# Patient Record
Sex: Female | Born: 1985 | Race: Black or African American | Hispanic: No | Marital: Single | Smoking: Never smoker
Health system: Southern US, Community
[De-identification: ages and names within clinical notes are randomized; demographics above are authoritative.]

## PROBLEM LIST (undated history)

## (undated) DIAGNOSIS — E611 Iron deficiency: Secondary | ICD-10-CM

## (undated) DIAGNOSIS — I10 Essential (primary) hypertension: Secondary | ICD-10-CM

## (undated) HISTORY — DX: Iron deficiency: E61.1

## (undated) HISTORY — PX: EYE SURGERY: SHX253

## (undated) HISTORY — PX: TONSILLECTOMY: SUR1361

## (undated) HISTORY — DX: Essential (primary) hypertension: I10

---

## 2019-06-26 ENCOUNTER — Ambulatory Visit: Payer: Self-pay | Admitting: Physician Assistant

## 2019-06-26 ENCOUNTER — Other Ambulatory Visit: Payer: Self-pay

## 2019-06-26 DIAGNOSIS — Z113 Encounter for screening for infections with a predominantly sexual mode of transmission: Secondary | ICD-10-CM

## 2019-06-26 LAB — WET PREP FOR TRICH, YEAST, CLUE
Trichomonas Exam: NEGATIVE
Yeast Exam: NEGATIVE

## 2019-06-27 ENCOUNTER — Encounter: Payer: Self-pay | Admitting: Physician Assistant

## 2019-06-27 NOTE — Progress Notes (Signed)
    STI clinic/screening visit  Subjective:  Katie Sutton is a 33 y.o. female being seen today for an STI screening visit. The patient reports they do not have symptoms.  Patient has the following medical conditions:  There are no active problems to display for this patient.    Chief Complaint  Patient presents with  . SEXUALLY TRANSMITTED DISEASE    HPI  Patient reports she has had a new partner and would like a screening.  Denies any symptoms today.  LMP 06/11/2019 and normal.  Using condoms as her BCM and is not interested in hormonal BCM.  See flowsheet for further details and programmatic requirements.    The following portions of the patient's history were reviewed and updated as appropriate: allergies, current medications, past medical history, past social history, past surgical history and problem list.  Objective:  There were no vitals filed for this visit.  Physical Exam Constitutional:      General: She is not in acute distress.    Appearance: Normal appearance. She is obese.  HENT:     Head: Normocephalic and atraumatic.     Mouth/Throat:     Mouth: Mucous membranes are moist.     Pharynx: Oropharynx is clear. No oropharyngeal exudate or posterior oropharyngeal erythema.  Eyes:     Conjunctiva/sclera: Conjunctivae normal.  Neck:     Musculoskeletal: Neck supple.  Pulmonary:     Effort: Pulmonary effort is normal.  Abdominal:     Palpations: Abdomen is soft. There is no mass.     Tenderness: There is no abdominal tenderness. There is no guarding or rebound.  Genitourinary:    General: Normal vulva.     Rectum: Normal.     Comments: External genitalia/pubic area without nits, lice, edema, erythema, lesions and inguinal adenopathy. Vagina with normal mucosa and discharge, pH=4.5. Cervix without visible lesions.  Uterus firm, mobile, nt, no masses, no CMT, no adnexal tenderness or fullness. Lymphadenopathy:     Cervical: No cervical adenopathy.   Skin:    General: Skin is warm and dry.     Findings: No bruising, erythema, lesion or rash.  Neurological:     Mental Status: She is alert and oriented to person, place, and time.  Psychiatric:        Mood and Affect: Mood normal.        Behavior: Behavior normal.        Thought Content: Thought content normal.        Judgment: Judgment normal.       Assessment and Plan:  Katie Sutton is a 33 y.o. female presenting to the [facility] Department for STI screening  1. Screening for STD (sexually transmitted disease) Patient into clinic for screening and is without symptoms today. Rec condoms with all sex. Await test results.  Counseled that RN will call if needs to RTC for any treatment once results are back. - WET PREP FOR [place], [facility], [place] [facility] - Syphilis Serology, [facility]     No follow-ups on file.  No future appointments.  Jerene Dilling, PA

## 2019-08-07 ENCOUNTER — Other Ambulatory Visit: Payer: Self-pay

## 2019-08-07 ENCOUNTER — Ambulatory Visit: Payer: Self-pay | Admitting: Advanced Practice Midwife

## 2019-08-07 ENCOUNTER — Encounter: Payer: Self-pay | Admitting: Advanced Practice Midwife

## 2019-08-07 DIAGNOSIS — Z113 Encounter for screening for infections with a predominantly sexual mode of transmission: Secondary | ICD-10-CM

## 2019-08-07 DIAGNOSIS — F1290 Cannabis use, unspecified, uncomplicated: Secondary | ICD-10-CM | POA: Insufficient documentation

## 2019-08-07 DIAGNOSIS — T7422XA Child sexual abuse, confirmed, initial encounter: Secondary | ICD-10-CM | POA: Insufficient documentation

## 2019-08-07 DIAGNOSIS — T7422XS Child sexual abuse, confirmed, sequela: Secondary | ICD-10-CM

## 2019-08-07 LAB — WET PREP FOR TRICH, YEAST, CLUE
Trichomonas Exam: NEGATIVE
Yeast Exam: NEGATIVE

## 2019-08-07 MED ORDER — METRONIDAZOLE 500 MG PO TABS: 500.0000 mg | ORAL_TABLET | Freq: Two times a day (BID) | ORAL | 0 refills | Status: AC

## 2019-08-07 NOTE — Addendum Note (Signed)
Addended by: Aileen Fass on: 08/07/2019 04:13 PM   Modules accepted: Orders

## 2019-08-07 NOTE — Progress Notes (Signed)
Wet mount reviewed. Patient tx'd for BV per SO
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
## 2019-08-26 ENCOUNTER — Ambulatory Visit: Payer: Medicaid Other | Admitting: Licensed Clinical Social Worker

## 2019-09-25 ENCOUNTER — Ambulatory Visit: Payer: Medicaid Other | Admitting: Licensed Clinical Social Worker

## 2019-09-25 DIAGNOSIS — F321 Major depressive disorder, single episode, moderate: Secondary | ICD-10-CM
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
## 2019-09-26 ENCOUNTER — Encounter: Payer: Self-pay | Admitting: Licensed Clinical Social Worker

## 2019-10-04 ENCOUNTER — Other Ambulatory Visit: Payer: Self-pay

## 2019-10-04 ENCOUNTER — Ambulatory Visit
Admission: EM | Admit: 2019-10-04 | Discharge: 2019-10-04 | Disposition: A | Discharge status: Home / Self Care | Payer: Self-pay | Attending: Family Medicine | Admitting: Family Medicine

## 2019-10-04 DIAGNOSIS — L03213 Periorbital cellulitis: Secondary | ICD-10-CM

## 2019-10-04 MED ORDER — AMOXICILLIN 875 MG PO TABS: 875.0000 mg | ORAL_TABLET | Freq: Two times a day (BID) | ORAL | 0 refills | Status: AC

## 2019-10-04 MED ORDER — SULFAMETHOXAZOLE-TRIMETHOPRIM 800-160 MG PO TABS: 1.0000 | ORAL_TABLET | Freq: Two times a day (BID) | ORAL | 0 refills | Status: AC

## 2019-10-04 NOTE — ED Triage Notes (Signed)
Patient complains of swelling above her left eye that has been intermittent since 01/21. States that area is painful at times. Patient states that she has tried Benadryl without much success.
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
## 2019-10-04 NOTE — Discharge Instructions (Addendum)
Take medication as prescribed. Warm compresses. Good hand hygiene.   Follow-up with ophthalmology in 3 days if symptoms persisting and not improving.  Follow up with your primary care physician this week as needed. Return to Urgent care for new or worsening concerns.

## 2019-10-06 ENCOUNTER — Ambulatory Visit: Payer: Medicaid Other | Admitting: Licensed Clinical Social Worker

## 2019-10-06 DIAGNOSIS — F321 Major depressive disorder, single episode, moderate: Secondary | ICD-10-CM

## 2019-10-06 NOTE — Progress Notes (Signed)
Counselor/Therapist Progress Note  Patient ID: Katie Sutton, MRN: [id number],    Date: 10/06/2019  Time Spent: 45 minutes    Treatment Type: Individual Therapy  Reported Symptoms: Obsessive thinking, Anhedonia, Sleep disturbance and depression  Mental Status Exam:  Appearance:   NA     Behavior:  Appropriate and Sharing  Motor:  NA  Speech/Language:   Normal Rate  Affect:  NA  Mood:  normal  Thought process:  normal  Thought content:    WNL  Sensory/Perceptual disturbances:    WNL  Orientation:  oriented to person, place, time/date, situation and day of week  Attention:  Good  Concentration:  Good  Memory:  WNL  Fund of knowledge:   Good  Insight:    Good  Judgment:   Good  Impulse Control:  Good   Risk Assessment: Danger to Self:  No Self-injurious Behavior: No Danger to Others: No Duty to Warn:no Physical Aggression / Violence:No  Access to Firearms a concern: No  Gang Involvement:No   Subjective: Patient was engaged and cooperative throughout the session using time effectively to discuss thoughts, feelings and treatment plan. Patient voices continued motivation for treatment and understanding of depression issues. Patient is likely to benefit from future treatment because she remains motivated to decrease depression and improve functioning.    Interventions: Cognitive Behavioral Therapy Established psychological safety. Checked in with patient and reviewed previous session, including assessment and goal of treatment. Reviewed CBTs. Explored patient's goal of treatment and worked collaboratively to develop CBT treatment plan. Provided support through active listening, validation of feelings, and highlighted patient's strengths.  Diagnosis:   ICD-10-CM   1. Major depressive disorder, single episode, moderate (HCC)  F32.1     Plan: patient's goal is to be happy to be able to feel the way she did before she lost her mom, to say her mom's name without crying    Treatment Target: Understand the relationship between thoughts, emotions, and behaviors  - Psychoeducation on CBT model   - Teach the connection between thoughts, emotions, and behaviors  Treatment Target: Continue to understand mood and behaviors  - Continue to assess symptoms  - Identify thoughts, emotions and behaviors that are problematic  - Identify possible interventions  Treatment Target: Increase understanding of grief and loss Objectives/treatment focus: Marland Kitchen Develop vocabulary to describe feelings of grief and loss . Identify grief and loss issues  . Identify steps toward managing grief . Gain awareness, and accept that grief and loss is causing problems Treatment Target: Increase realistic balanced thinking  - Explore patient's thoughts, beliefs, automatic thoughts, assumptions  - Identify unhelpful thinking patterns  - Process distress and allow for emotional release  - Evaluate thoughts - Cognitive reappraisal  Treatment Target: Reducing vulnerability to "emotional mind" - Values clarification   - Self-care - nutrition, sleep, exercise  - Teach mindfulness-  focus on awareness of thoughts and feelings without attachment or judgment - Practice Mindfulness  - Increase positive events  o Activity planning   Future Appointments  Date Time Provider Department Center  10/13/2019  4:00 PM Kathreen Cosier, LCSW [facility] None    Interpreter used: NA   Kathreen Cosier, LCSW

## 2019-10-13 ENCOUNTER — Ambulatory Visit: Payer: Medicaid Other | Admitting: Licensed Clinical Social Worker

## 2019-10-14 ENCOUNTER — Telehealth: Payer: Self-pay | Admitting: Licensed Clinical Social Worker

## 2019-10-14 NOTE — Telephone Encounter (Signed)
-----   Message from Kathreen Cosier, [place] sent at 10/13/2019  4:06 PM EST ----- Regarding: missed appt Call patient to follow up on missed appt.

## 2019-10-16 ENCOUNTER — Ambulatory Visit: Payer: Medicaid Other | Admitting: Physician Assistant

## 2019-10-16 ENCOUNTER — Other Ambulatory Visit: Payer: Self-pay

## 2019-10-16 ENCOUNTER — Encounter: Payer: Self-pay | Admitting: Physician Assistant

## 2019-10-16 DIAGNOSIS — Z113 Encounter for screening for infections with a predominantly sexual mode of transmission: Secondary | ICD-10-CM

## 2019-10-16 LAB — WET PREP FOR TRICH, YEAST, CLUE
Trichomonas Exam: NEGATIVE
Yeast Exam: NEGATIVE
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
## 2019-12-12 ENCOUNTER — Other Ambulatory Visit: Payer: Self-pay

## 2019-12-12 ENCOUNTER — Other Ambulatory Visit: Payer: Self-pay | Admitting: Ophthalmology

## 2019-12-12 DIAGNOSIS — H04033 Chronic enlargement of bilateral lacrimal glands: Secondary | ICD-10-CM

## 2019-12-17 ENCOUNTER — Ambulatory Visit: Payer: Medicaid Other

## 2019-12-23 ENCOUNTER — Other Ambulatory Visit: Payer: Self-pay

## 2019-12-23 ENCOUNTER — Ambulatory Visit
Admission: RE | Admit: 2019-12-23 | Discharge: 2019-12-23 | Disposition: A | Discharge status: Home / Self Care | Payer: Medicaid Other | Source: Ambulatory Visit | Attending: Ophthalmology | Admitting: Ophthalmology

## 2019-12-23 DIAGNOSIS — H04033 Chronic enlargement of bilateral lacrimal glands: Secondary | ICD-10-CM

## 2019-12-23 MED ORDER — IOHEXOL 300 MG/ML  SOLN
75.0000 mL | Freq: Once | INTRAMUSCULAR | Status: AC | PRN
  Administered 2019-12-23: 75 mL via INTRAVENOUS

## 2020-01-12 ENCOUNTER — Ambulatory Visit: Payer: Self-pay | Admitting: Physician Assistant

## 2020-01-12 ENCOUNTER — Encounter: Payer: Self-pay | Admitting: Physician Assistant

## 2020-01-12 ENCOUNTER — Other Ambulatory Visit: Payer: Self-pay

## 2020-01-12 DIAGNOSIS — Z113 Encounter for screening for infections with a predominantly sexual mode of transmission: Secondary | ICD-10-CM

## 2020-01-12 LAB — WET PREP FOR TRICH, YEAST, CLUE
Trichomonas Exam: NEGATIVE
Yeast Exam: NEGATIVE
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
## 2020-01-17 LAB — GONOCOCCUS CULTURE

## 2020-04-01 ENCOUNTER — Encounter: Payer: Self-pay | Admitting: Physician Assistant

## 2020-04-01 ENCOUNTER — Ambulatory Visit: Payer: Self-pay | Admitting: Physician Assistant

## 2020-04-01 ENCOUNTER — Other Ambulatory Visit: Payer: Self-pay

## 2020-04-01 ENCOUNTER — Ambulatory Visit: Payer: Medicaid Other

## 2020-04-01 DIAGNOSIS — Z113 Encounter for screening for infections with a predominantly sexual mode of transmission: Secondary | ICD-10-CM

## 2020-04-01 DIAGNOSIS — A599 Trichomoniasis, unspecified: Secondary | ICD-10-CM

## 2020-04-01 LAB — WET PREP FOR TRICH, YEAST, CLUE
Trichomonas Exam: POSITIVE — AB
Yeast Exam: NEGATIVE

## 2020-04-01 MED ORDER — METRONIDAZOLE 500 MG PO TABS: 2000.0000 mg | ORAL_TABLET | Freq: Once | ORAL | 0 refills | Status: AC
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
## 2020-04-01 NOTE — Progress Notes (Signed)
Wet mount reviewed, patient treated for Trich per SO..Cass Edinger Brewer-Jensen, RN  ?

## 2020-04-01 NOTE — Progress Notes (Signed)
Here for STD testing..Tennis Mckinnon Brewer-Jensen, RN  

## 2020-04-16 ENCOUNTER — Ambulatory Visit: Payer: Self-pay

## 2020-06-30 ENCOUNTER — Ambulatory Visit: Payer: Medicaid Other

## 2020-07-08 ENCOUNTER — Ambulatory Visit: Payer: Medicaid Other | Admitting: Physician Assistant

## 2020-07-08 ENCOUNTER — Encounter: Payer: Self-pay | Admitting: Physician Assistant

## 2020-07-08 ENCOUNTER — Other Ambulatory Visit: Payer: Self-pay

## 2020-07-08 DIAGNOSIS — Z113 Encounter for screening for infections with a predominantly sexual mode of transmission: Secondary | ICD-10-CM

## 2020-07-08 DIAGNOSIS — B9689 Other specified bacterial agents as the cause of diseases classified elsewhere: Secondary | ICD-10-CM

## 2020-07-08 DIAGNOSIS — N760 Acute vaginitis: Secondary | ICD-10-CM

## 2020-07-08 LAB — WET PREP FOR TRICH, YEAST, CLUE
Trichomonas Exam: NEGATIVE
Yeast Exam: NEGATIVE

## 2020-07-08 MED ORDER — METRONIDAZOLE 500 MG PO TABS: 500.0000 mg | ORAL_TABLET | Freq: Two times a day (BID) | ORAL | 0 refills | Status: AC

## 2020-07-08 NOTE — Progress Notes (Signed)
Wet mount reviewed with provider and pt treated for BV per provider verbal order and per standing order. Provider orders completed.
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
## 2020-07-13 LAB — GONOCOCCUS CULTURE

## 2020-07-28 ENCOUNTER — Ambulatory Visit
Admission: RE | Admit: 2020-07-28 | Discharge: 2020-07-28 | Disposition: A | Discharge status: Home / Self Care | Payer: Medicaid Other | Source: Ambulatory Visit | Attending: Emergency Medicine | Admitting: Emergency Medicine

## 2020-07-28 ENCOUNTER — Other Ambulatory Visit: Payer: Self-pay

## 2020-07-28 VITALS — BP 128/69 | HR 71 | Temp 98.2°F | Resp 18 | Ht 62.0 in | Wt 240.0 lb

## 2020-07-28 DIAGNOSIS — N6320 Unspecified lump in the left breast, unspecified quadrant: Secondary | ICD-10-CM | POA: Diagnosis not present

## 2020-07-28 DIAGNOSIS — N6310 Unspecified lump in the right breast, unspecified quadrant: Secondary | ICD-10-CM | POA: Diagnosis not present

## 2020-07-28 DIAGNOSIS — N61 Mastitis without abscess: Secondary | ICD-10-CM

## 2020-07-28 MED ORDER — CEPHALEXIN 500 MG PO CAPS: 1000.0000 mg | ORAL_CAPSULE | Freq: Two times a day (BID) | ORAL | 0 refills | Status: AC

## 2020-07-28 MED ORDER — IBUPROFEN 600 MG PO TABS: 600.0000 mg | ORAL_TABLET | Freq: Four times a day (QID) | ORAL | 0 refills | Status: DC | PRN

## 2020-07-28 NOTE — Discharge Instructions (Addendum)
You have an infection of your right breast.  Finish the Keflex, even if you feel better.  Warm compresses.  Check a urine pregnancy.  If it is negative, then you can take the ibuprofen combined with 1000 mg of Tylenol together 3-4 times a day as needed for pain.  If it is positive, we can only take the Tylenol.  You will need to follow-up with OB/GYN ASAP.  I have ordered a mammogram to be done at the [facility] breast center.  They will be able to coordinate all of your care if there are any problems found.  You can also call them and schedule an appointment/mammogram.  Follow-up with a primary care provider of your choice, see list below.  Here is a list of primary care providers who are taking new patients:  Dr. Elizabeth Sauer [address] [place] [address] [phone]  [facility] at [facility] [address] [place], [place] [address] [phone]  [facility] [place] [address] [place] [address]  [phone]  [facility] [address] [place], [place] [address] [phone]  [facility] [address] [place]  [id number] [place], [place] [address]  Here are clinics/ other resources who will see you if you do not have insurance. Some have certain criteria that you must meet. Call them and find out what they are:  [facility]: [address], [place], [place] [address] Phone: [phone] Hours: First and Third Saturdays of each Month, 9 a.m. - 1 p.m.  [facility]: [address], [address] [place], [place] [address] Phone: [phone] Hours: Tuesday, 4 p.m. - 8 p.m. Thursday, 1 p.m. - 8 p.m. Wednesday, 9 a.m. - [facility] [address], [place], [place] [address] Phone: [phone] Pharmacy Phone Number: [phone] Dental Phone Number: [phone] [facility] Insurance Help: [phone]  Dental Hours: Monday - Thursday, 8 a.m. - 6 p.m.  [facility] [address] [place], [place] [address] Phone: [phone] Pharmacy Phone Number: [phone] [facility] Insurance Help: [phone]  [facility] [address] [place] [place], [place] [address] Phone: [phone] Pharmacy Phone Number: [phone] [facility] Insurance Help: [phone]  [facility] [address] [place], [place] [address] Phone: [phone] [facility] Insurance Help: [phone]   [facility] [address] [place], [place] [address] Phone: [phone]  Go to www.goodrx.com to look up your medications. This will give you a list of where you can find your prescriptions at the most affordable prices. Or ask the pharmacist what the cash price is, or if they have any other discount programs available to help make your medication more affordable. This can be less expensive than what you would pay with insurance.

## 2020-07-28 NOTE — ED Provider Notes (Signed)
HPI  SUBJECTIVE:  Katie Sutton is a 34 y.o. female who presents with an erythematous, painful area on her right breast over the past 4 days.  She is not sure if it has changed in size.  She states she felt feverish 2 days ago, but did not have a thermometer.  She has a 41-year-old nipple piercing in this breast, removed the jewelry 2 days ago.  No recent change in jewelry change.  No axillary pain, body aches.  No nipple discharge.  She reports some discharge from the piercing tract.  No skin changes.  No nipple swelling, unintentional weight loss, night sweats.  She tried 1000 g of Tylenol 2 days ago with improvement in her symptoms.  Symptoms are worse with wearing a bra, palpation.  No antibiotics in the past month.  No antipyretic in the past 6 hours.  She does not do breast self exams.  She has never had a mammogram.  She is not breast-feeding.  No history of diabetes, MRSA, abscesses, hypertension, HIV, cancer, immunocompromise.  Family history negative for breast cancer.  LMP: 11/8.  She is not sure if she could be pregnant.  She is not on any birth control.  PMD: None.    History reviewed. No pertinent past medical history.  Past Surgical History:  Procedure Laterality Date  . CESAREAN SECTION    . TONSILLECTOMY      Family History  Problem Relation Age of Onset  . Cancer Mother   . Cancer Father     Social History   Tobacco Use  . Smoking status: Never Smoker  . Smokeless tobacco: Never Used  Vaping Use  . Vaping Use: Never used  Substance Use Topics  . Alcohol use: Yes    Comment: occasionally  . Drug use: Yes    Types: Marijuana    Comment: last use 07/31/19    No current facility-administered medications for this encounter.  Current Outpatient Medications:  .  cephALEXin (KEFLEX) 500 MG capsule, Take 2 capsules (1,000 mg total) by mouth 2 (two) times daily for 7 days., Disp: 28 capsule, Rfl: 0 .  ibuprofen (ADVIL) 600 MG tablet, Take 1 tablet (600 mg total)  by mouth every 6 (six) hours as needed., Disp: 30 tablet, Rfl: 0  No Known Allergies   ROS  As noted in HPI.   Physical Exam  BP 128/69 (BP Location: Right Arm)   Pulse 71   Temp 98.2 F (36.8 C) (Oral)   Resp 18   Ht 5\' 2"  (1.575 m)   Wt 108.9 kg   LMP 07/12/2020   SpO2 100%   BMI 43.90 kg/m   Constitutional: Well developed, well nourished, no acute distress Eyes:  EOMI, conjunctiva normal bilaterally HENT: Normocephalic, atraumatic,mucus membranes moist Respiratory: Normal inspiratory effort Cardiovascular: Normal rate Breast: Breast pendulous, symmetric.  Right breast: 7 x 8 cm tender area of erythema in the 10 o'clock position.  Marked area of erythema with a marker for reference.  No skin changes, induration.  Small nontender mass in the 1 o'clock position, another small nontender mobile mass in the 7 o'clock position.  No nipple drainage.  No drainage from the piercing.  No axillary lymphadenopathy Left breast.  Nontender small mobile mass in the 6 o'clock position.  No skin changes, erythema.  No axillary lymphadenopathy.  Patient declined chaperone. GI: nondistended skin: No rash, skin intact Musculoskeletal: no deformities Neurologic: Alert & oriented x 3, no focal neuro deficits Psychiatric: Speech and behavior appropriate  ED Course   Medications - No data to display  Orders Placed This Encounter  Procedures  . MM Digital Diagnostic Bilat    Standing Status:   Future    Standing Expiration Date:   07/28/2021    Order Specific Question:   Reason for Exam (SYMPTOM  OR DIAGNOSIS REQUIRED)    Answer:   bilateral breast masses    Order Specific Question:   Is the patient pregnant?    Answer:   No    Order Specific Question:   [facility]    [facility]    Order Specific Question:   Release to patient    Answer:   Immediate    No results found for this or any previous visit (from the past 24 hour(s)). No results  found.  ED Clinical Impression  1. Mastitis, acute   2. Masses of both breasts      ED Assessment/Plan  Patient with a mastitis.  Will send home with Keflex 1000 mg twice daily for 7 days.  She also has several nontender masses in both breasts.  She has no axillary lymphadenopathy.  She has never had a mammogram.  Suspect fibrocystic breasts rather than malignancy, however will order diagnostic mammogram at the breast center of [facility] with ultrasound if necessary.    Patient has decided to check at home urine pregnancy test rather than wait for 1 here.  We will send her home with a prescription for ibuprofen.  She is to check a pregnancy test before starting this.  If pregnant, advised her to not fill this but take Tylenol only.  Advised that she will need to follow-up with an OB/GYN if she is pregnant.  Also providing primary care list for ongoing care.   Discussed  MDM, treatment plan, and plan for follow-up with patient. patient agrees with plan.   Meds ordered this encounter  Medications  . ibuprofen (ADVIL) 600 MG tablet    Sig: Take 1 tablet (600 mg total) by mouth every 6 (six) hours as needed.    Dispense:  30 tablet    Refill:  0  . cephALEXin (KEFLEX) 500 MG capsule    Sig: Take 2 capsules (1,000 mg total) by mouth 2 (two) times daily for 7 days.    Dispense:  28 capsule    Refill:  0    *This clinic note was created using Scientist, clinical (histocompatibility and immunogenetics). Therefore, there may be occasional mistakes despite careful proofreading.   ?    Domenick Gong, MD 07/28/20 1246

## 2020-07-28 NOTE — ED Triage Notes (Signed)
Patient c/o right breast pain that started 3 days ago. She had her nipple pierced 2 years ago and took this out. She states she felt a knot above her nipple area.

## 2020-08-17 ENCOUNTER — Other Ambulatory Visit: Payer: Self-pay | Admitting: Emergency Medicine

## 2020-08-17 DIAGNOSIS — N63 Unspecified lump in unspecified breast: Secondary | ICD-10-CM

## 2020-09-28 ENCOUNTER — Ambulatory Visit
Admission: RE | Admit: 2020-09-28 | Discharge: 2020-09-28 | Disposition: A | Discharge status: Home / Self Care | Payer: Medicaid Other | Source: Ambulatory Visit | Attending: Emergency Medicine | Admitting: Emergency Medicine

## 2020-09-28 ENCOUNTER — Other Ambulatory Visit: Payer: Self-pay

## 2020-09-28 DIAGNOSIS — N63 Unspecified lump in unspecified breast: Secondary | ICD-10-CM

## 2020-11-05 ENCOUNTER — Ambulatory Visit: Payer: Medicaid Other

## 2020-11-12 ENCOUNTER — Ambulatory Visit: Payer: Medicaid Other

## 2020-11-22 ENCOUNTER — Ambulatory Visit: Payer: Medicaid Other | Admitting: Physician Assistant

## 2020-11-22 ENCOUNTER — Other Ambulatory Visit: Payer: Self-pay

## 2020-11-22 ENCOUNTER — Encounter: Payer: Self-pay | Admitting: Physician Assistant

## 2020-11-22 DIAGNOSIS — Z113 Encounter for screening for infections with a predominantly sexual mode of transmission: Secondary | ICD-10-CM

## 2020-11-22 LAB — WET PREP FOR TRICH, YEAST, CLUE
Trichomonas Exam: NEGATIVE
Yeast Exam: NEGATIVE

## 2020-11-22 LAB — HM HIV SCREENING LAB: HM HIV Screening: NEGATIVE
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
## 2021-07-25 IMAGING — MG DIGITAL DIAGNOSTIC BILAT W/ TOMO W/ CAD
6 of 10 series · 6 of 30 positions shown · non-contrast
Comparison: None.

CLINICAL DATA: Patient presents for evaluation of bilateral
physician detected palpable abnormalities.

EXAM:
DIGITAL DIAGNOSTIC BILATERAL MAMMOGRAM WITH CAD AND TOMOSYNTHESIS
ULTRASOUND BILATERAL BREAST
TECHNIQUE: Bilateral digital diagnostic mammography and breast tomosynthesis
was performed. Digital images of the breasts were evaluated with
computer-aided detection. Targeted ultrasound examination of the
Bilateral breast was performed.

[R CC synth-2D]
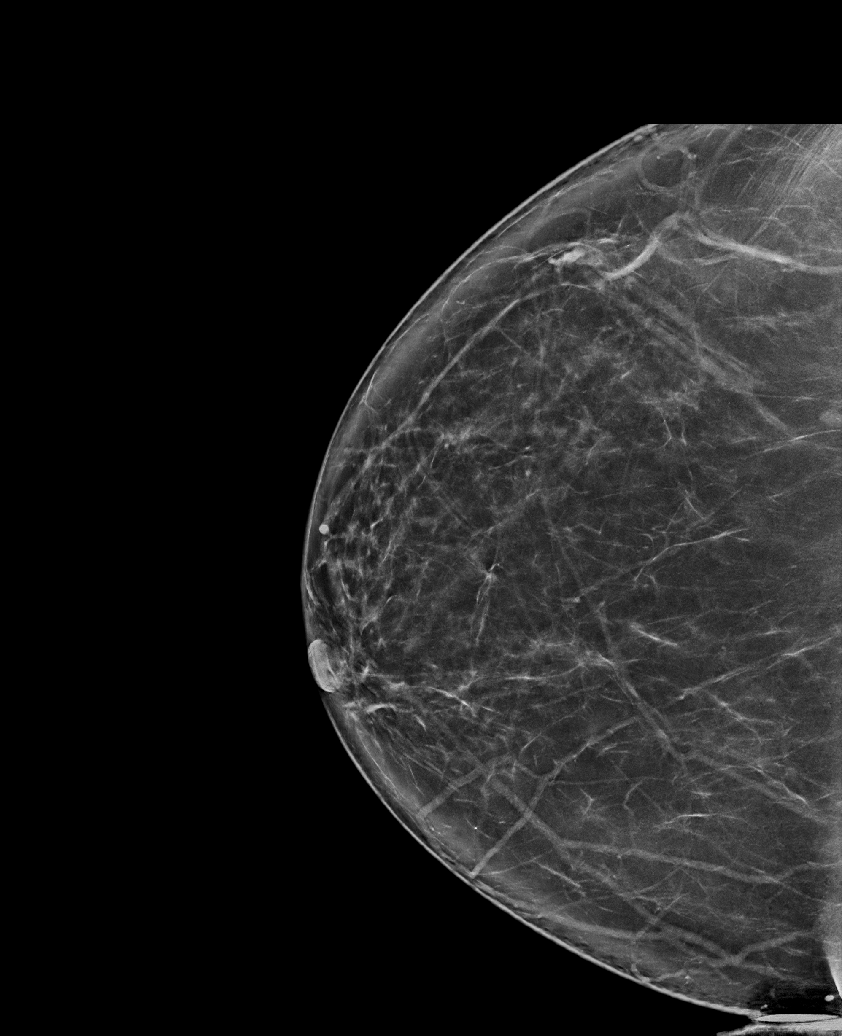

[L MLO synth-2D]
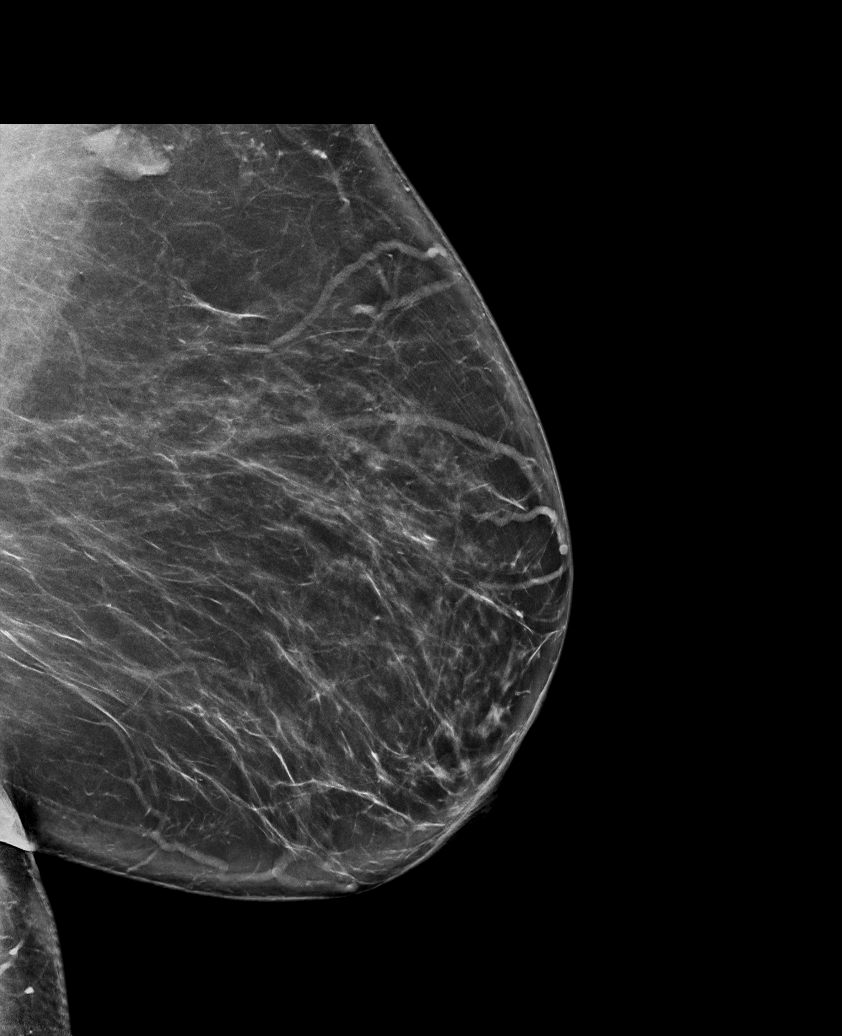

[R XCCL synth-2D]
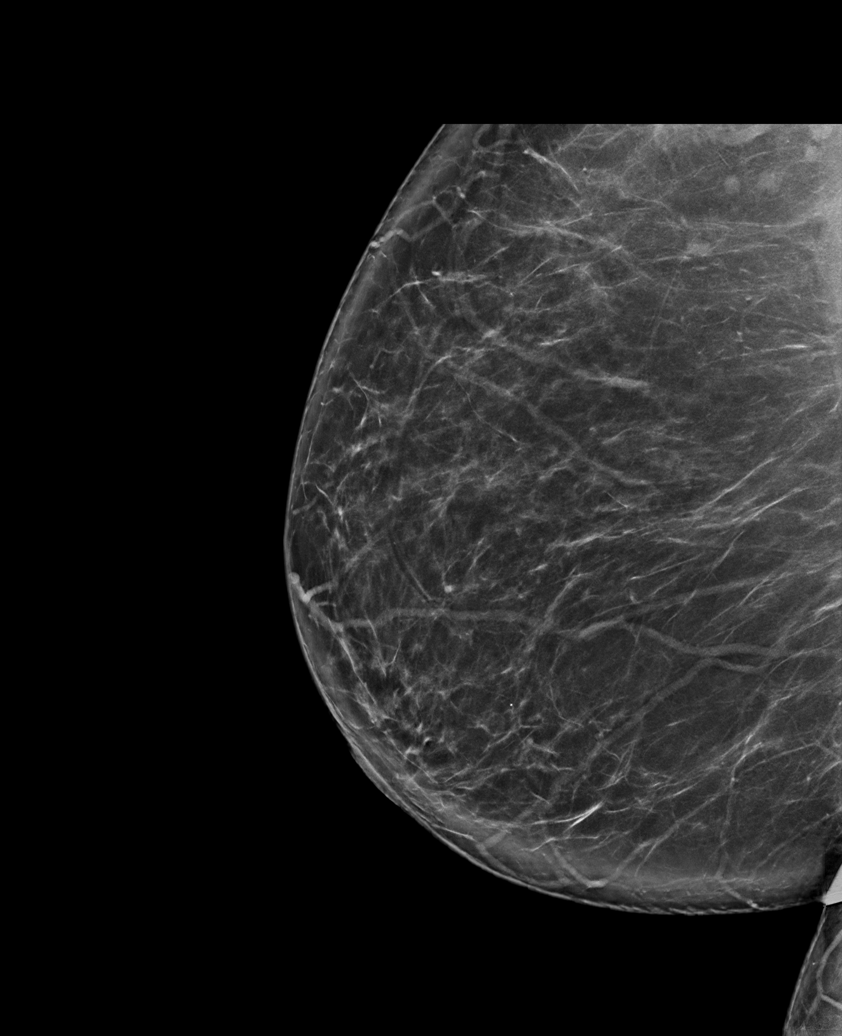

[R MLO synth-2D]
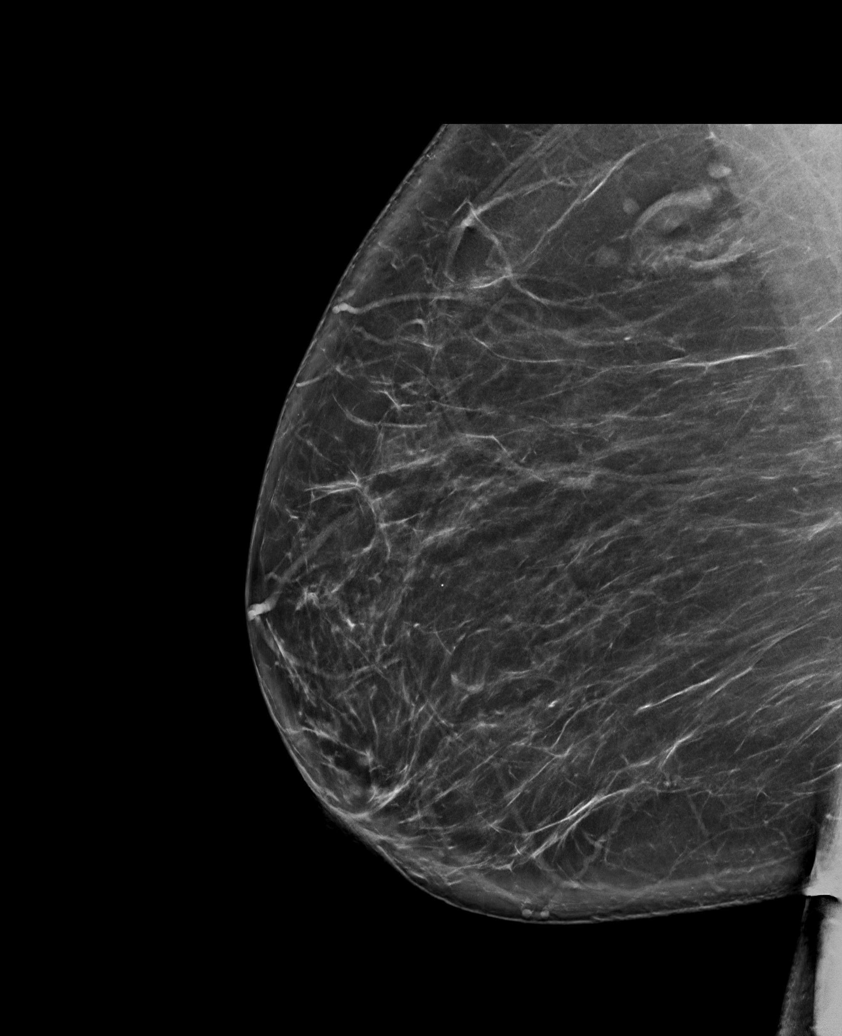

[L CC synth-2D]
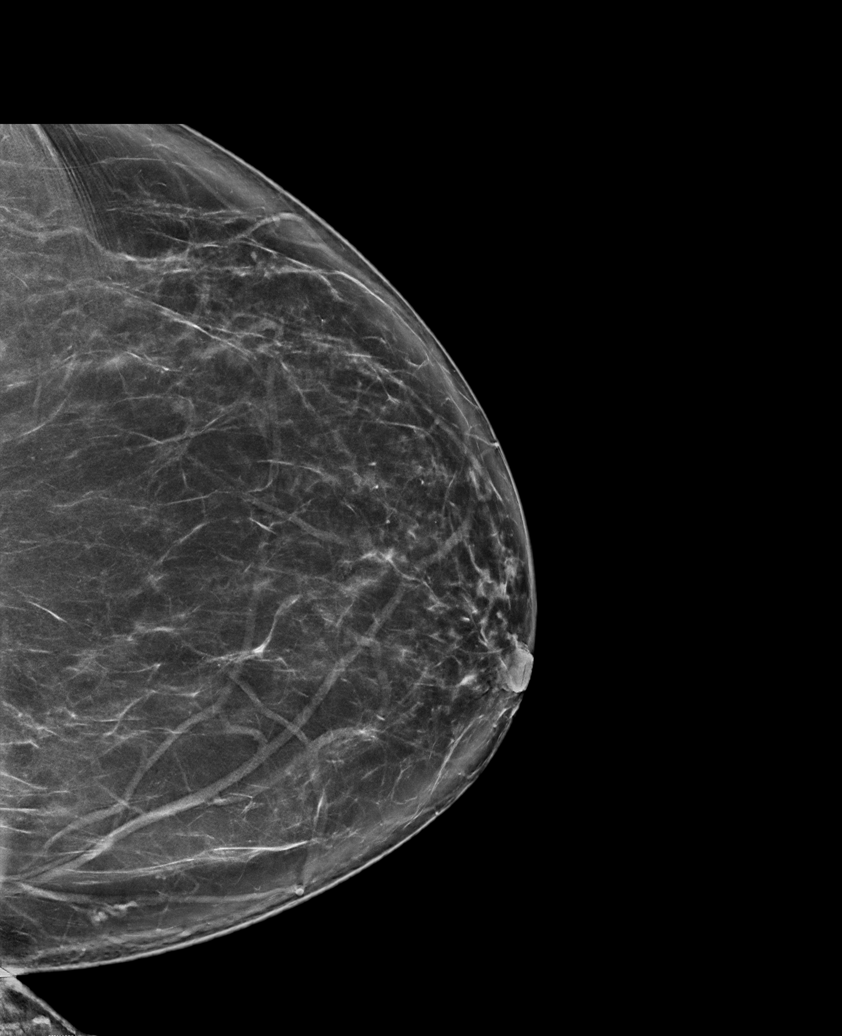

[R CC tomo · tomo slice 48/95.0]
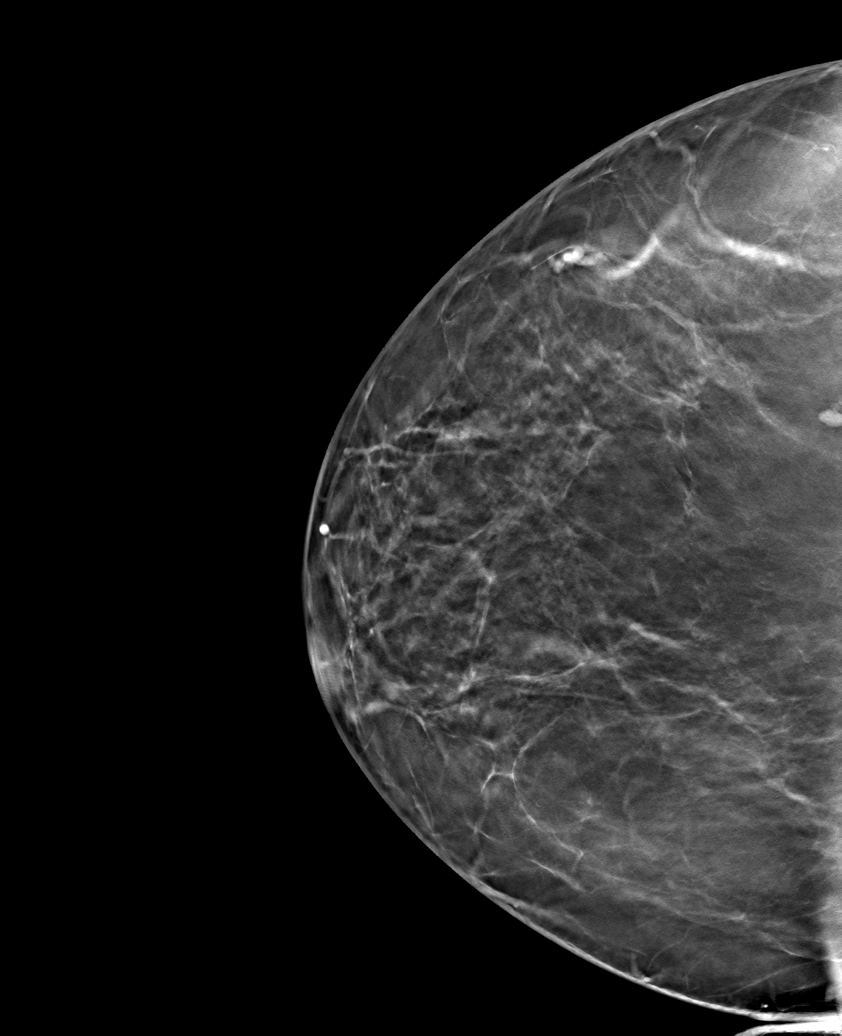

[6 of 30 positions shown; findings below may reference images not displayed]

ACR Breast Density Category b: There are scattered areas of
fibroglandular density.
FINDINGS: No concerning masses, calcifications or nonsurgical distortion
identified within either breast.

Targeted ultrasound is performed, showing normal tissue without
suspicious mass within the right breast 1 o'clock position at the
site of palpable concern, 10 o'clock position of the site of
palpable concern and 7 o'clock position at the site of palpable
concern.

Normal tissue without suspicious mass within the left breast 6
o'clock position at the site of palpable concern.
IMPRESSION: No mammographic evidence for malignancy.

No suspicious findings on ultrasound at the sites of palpable
concern bilaterally.

RECOMMENDATION:
Continued clinical evaluation for bilateral palpable abnormalities.

Screening mammogram at age 40 unless there are persistent or
intervening clinical concerns. (Code:H4-Y-QCE)

I have discussed the findings and recommendations with the patient.
If applicable, a reminder letter will be sent to the patient
regarding the next appointment.

BI-RADS CATEGORY  2: Benign.

## 2021-07-25 IMAGING — US US BREAST*R* LIMITED INC AXILLA
1 series · 3 of 3 positions shown · non-contrast
Comparison: None.

CLINICAL DATA: Patient presents for evaluation of bilateral
physician detected palpable abnormalities.

EXAM:
DIGITAL DIAGNOSTIC BILATERAL MAMMOGRAM WITH CAD AND TOMOSYNTHESIS
ULTRASOUND BILATERAL BREAST
TECHNIQUE: Bilateral digital diagnostic mammography and breast tomosynthesis
was performed. Digital images of the breasts were evaluated with
computer-aided detection. Targeted ultrasound examination of the
Bilateral breast was performed.

[Series 1: us breast*right* limited inc axilla · 0.08mm/px · 3 of 3 slices shown]
[im 1/3]
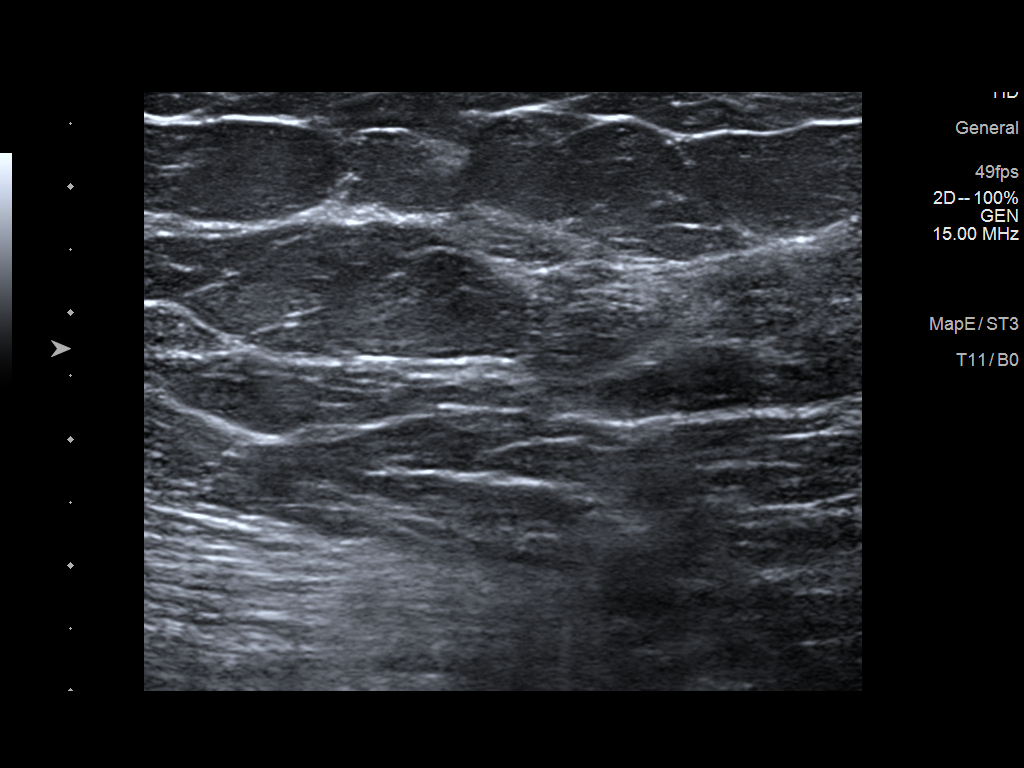
[im 2/3]
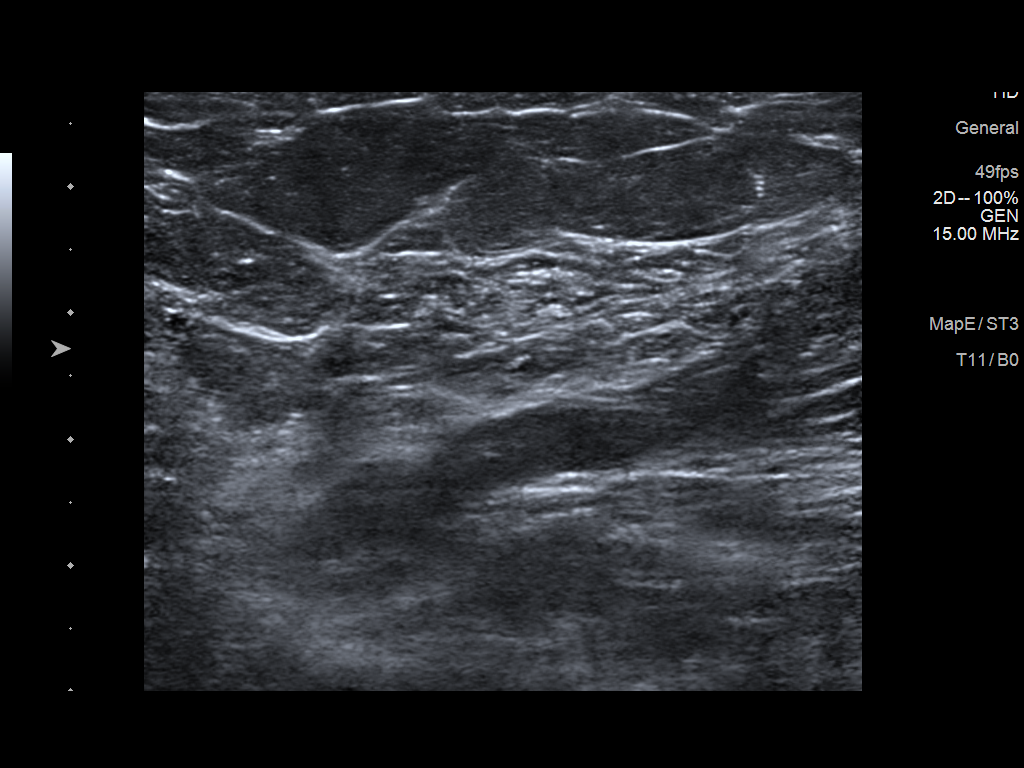
[im 3/3]
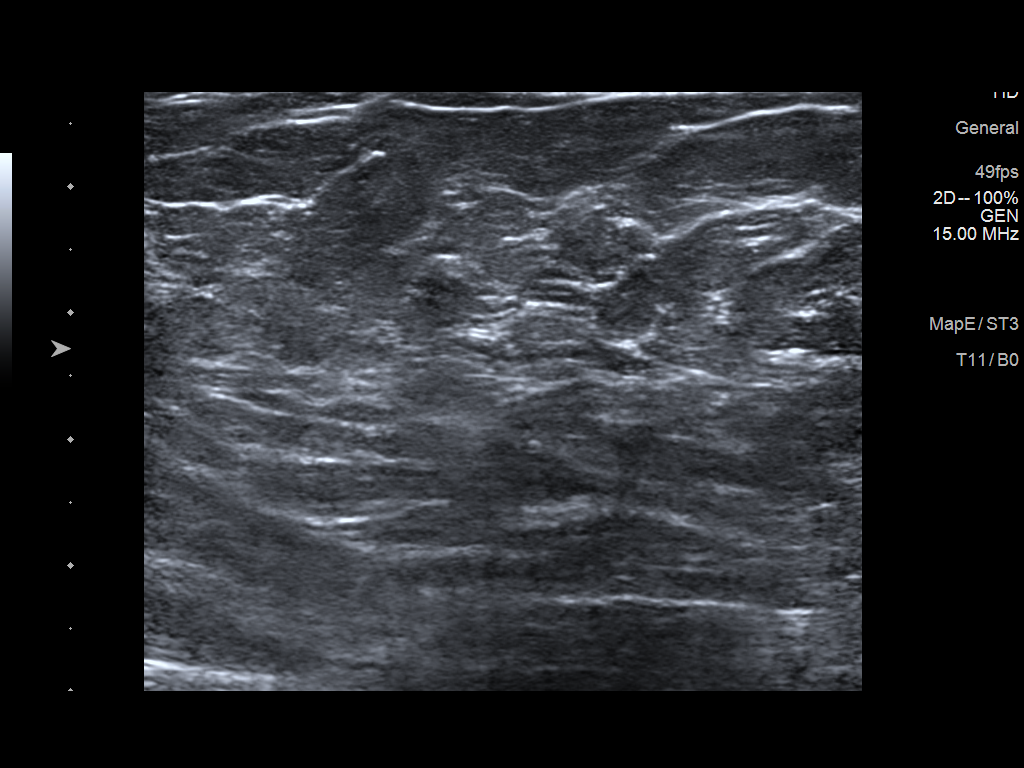

[3 of 3 positions shown; findings below may reference images not displayed]

ACR Breast Density Category b: There are scattered areas of
fibroglandular density.
FINDINGS: No concerning masses, calcifications or nonsurgical distortion
identified within either breast.

Targeted ultrasound is performed, showing normal tissue without
suspicious mass within the right breast 1 o'clock position at the
site of palpable concern, 10 o'clock position of the site of
palpable concern and 7 o'clock position at the site of palpable
concern.

Normal tissue without suspicious mass within the left breast 6
o'clock position at the site of palpable concern.
IMPRESSION: No mammographic evidence for malignancy.

No suspicious findings on ultrasound at the sites of palpable
concern bilaterally.

RECOMMENDATION:
Continued clinical evaluation for bilateral palpable abnormalities.

Screening mammogram at age 40 unless there are persistent or
intervening clinical concerns. (Code:H4-Y-QCE)

I have discussed the findings and recommendations with the patient.
If applicable, a reminder letter will be sent to the patient
regarding the next appointment.

BI-RADS CATEGORY  2: Benign.

## 2021-09-12 ENCOUNTER — Ambulatory Visit: Payer: Medicaid Other | Admitting: Nurse Practitioner

## 2021-09-12 ENCOUNTER — Other Ambulatory Visit: Payer: Self-pay

## 2021-09-12 ENCOUNTER — Other Ambulatory Visit: Payer: Self-pay | Admitting: Nurse Practitioner

## 2021-09-12 ENCOUNTER — Encounter: Payer: Self-pay | Admitting: Nurse Practitioner

## 2021-09-12 DIAGNOSIS — Z113 Encounter for screening for infections with a predominantly sexual mode of transmission: Secondary | ICD-10-CM

## 2021-09-12 DIAGNOSIS — N760 Acute vaginitis: Secondary | ICD-10-CM

## 2021-09-12 DIAGNOSIS — B9689 Other specified bacterial agents as the cause of diseases classified elsewhere: Secondary | ICD-10-CM

## 2021-09-12 LAB — WET PREP FOR TRICH, YEAST, CLUE
Trichomonas Exam: NEGATIVE
Yeast Exam: NEGATIVE

## 2021-09-12 MED ORDER — CLINDAMYCIN HCL 300 MG PO CAPS: 300.0000 mg | ORAL_CAPSULE | Freq: Two times a day (BID) | ORAL | 0 refills | Status: AC
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
## 2021-09-14 DIAGNOSIS — N838 Other noninflammatory disorders of ovary, fallopian tube and broad ligament: Secondary | ICD-10-CM | POA: Diagnosis not present

## 2021-09-14 DIAGNOSIS — E669 Obesity, unspecified: Secondary | ICD-10-CM | POA: Diagnosis not present

## 2021-09-14 DIAGNOSIS — K769 Liver disease, unspecified: Secondary | ICD-10-CM | POA: Diagnosis not present

## 2021-09-14 DIAGNOSIS — R160 Hepatomegaly, not elsewhere classified: Secondary | ICD-10-CM | POA: Diagnosis not present

## 2021-09-14 DIAGNOSIS — R197 Diarrhea, unspecified: Secondary | ICD-10-CM | POA: Diagnosis not present

## 2021-09-14 DIAGNOSIS — Z20822 Contact with and (suspected) exposure to covid-19: Secondary | ICD-10-CM | POA: Diagnosis not present

## 2021-09-14 DIAGNOSIS — R1011 Right upper quadrant pain: Secondary | ICD-10-CM | POA: Diagnosis not present

## 2021-09-14 DIAGNOSIS — J029 Acute pharyngitis, unspecified: Secondary | ICD-10-CM | POA: Diagnosis not present

## 2021-09-14 DIAGNOSIS — R1013 Epigastric pain: Secondary | ICD-10-CM | POA: Diagnosis not present

## 2021-09-14 DIAGNOSIS — M549 Dorsalgia, unspecified: Secondary | ICD-10-CM | POA: Diagnosis not present

## 2021-09-16 LAB — GONOCOCCUS CULTURE

## 2022-02-07 ENCOUNTER — Ambulatory Visit: Payer: Medicaid Other | Admitting: Family Medicine

## 2022-02-07 ENCOUNTER — Encounter: Payer: Self-pay | Admitting: Family Medicine

## 2022-02-07 ENCOUNTER — Ambulatory Visit: Payer: Medicaid Other

## 2022-02-07 DIAGNOSIS — Z113 Encounter for screening for infections with a predominantly sexual mode of transmission: Secondary | ICD-10-CM | POA: Diagnosis not present

## 2022-02-07 DIAGNOSIS — A599 Trichomoniasis, unspecified: Secondary | ICD-10-CM

## 2022-02-07 LAB — WET PREP FOR TRICH, YEAST, CLUE
Trichomonas Exam: POSITIVE — AB
Yeast Exam: NEGATIVE

## 2022-02-07 LAB — HM HIV SCREENING LAB: HM HIV Screening: NEGATIVE
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
## 2022-02-07 NOTE — Progress Notes (Signed)
Pt here for STD screening.  Wet mount results reviewed.  Provider will send Rx to pt's Pharmacy.  Condoms declined.  Berdie Ogren, RN

## 2022-02-08 ENCOUNTER — Other Ambulatory Visit: Payer: Self-pay | Admitting: Family Medicine

## 2022-02-08 ENCOUNTER — Telehealth: Payer: Self-pay | Admitting: Family Medicine

## 2022-02-08 DIAGNOSIS — A599 Trichomoniasis, unspecified: Secondary | ICD-10-CM

## 2022-02-08 NOTE — Telephone Encounter (Signed)
Pt states that she was here for an appointment yesterday 02/07/22 and that she was told she would be called about a prescription for a new medication because she is allergic to the medication she took previously. Please call.

## 2022-02-12 LAB — GONOCOCCUS CULTURE

## 2022-03-03 ENCOUNTER — Ambulatory Visit
Admission: RE | Admit: 2022-03-03 | Discharge: 2022-03-03 | Disposition: A | Discharge status: Home / Self Care | Payer: Medicaid Other | Source: Ambulatory Visit | Attending: Emergency Medicine | Admitting: Emergency Medicine

## 2022-03-03 VITALS — BP 153/91 | HR 120 | Temp 98.2°F | Resp 14 | Ht 62.0 in | Wt 246.0 lb

## 2022-03-03 DIAGNOSIS — J069 Acute upper respiratory infection, unspecified: Secondary | ICD-10-CM

## 2022-03-03 LAB — GROUP A STREP BY PCR: Group A Strep by PCR: NOT DETECTED

## 2022-03-03 MED ORDER — IBUPROFEN 800 MG PO TABS: 800.0000 mg | ORAL_TABLET | Freq: Three times a day (TID) | ORAL | 0 refills | Status: DC

## 2022-03-03 MED ORDER — LIDOCAINE VISCOUS HCL 2 % MT SOLN: 15.0000 mL | OROMUCOSAL | 0 refills | Status: DC | PRN

## 2022-03-03 NOTE — Discharge Instructions (Addendum)
Your symptoms today are most likely being caused by a virus and should steadily improve in time it can take up to 7 to 10 days before you truly start to see a turnaround however things will get better  Strep test is negative for bacteria  You may gargle and spit lidocaine solution every 4 hours as needed to provide a temporary numbing effect to the throat  You may take ibuprofen 800 mg every 8 hours for general comfort, may use Tylenol 500 to 1000 mg every 6 hours in addition    For cough: honey 1/2 to 1 teaspoon (you can dilute the honey in water or another fluid).  You can also use guaifenesin and dextromethorphan for cough. You can use a humidifier for chest congestion and cough.  If you don't have a humidifier, you can sit in the bathroom with the hot shower running.      For sore throat: try warm salt water gargles, cepacol lozenges, throat spray, warm tea or water with lemon/honey, popsicles or ice, or OTC cold relief medicine for throat discomfort.   For congestion: take a daily anti-histamine like Zyrtec, Claritin, and a oral decongestant, such as pseudoephedrine.  You can also use Flonase 1-2 sprays in each nostril daily.   It is important to stay hydrated: drink plenty of fluids (water, gatorade/powerade/pedialyte, juices, or teas) to keep your throat moisturized and help further relieve irritation/discomfort.
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
## 2022-03-03 NOTE — ED Triage Notes (Signed)
Patient c/o sore throat and pain when swallowing that started 3 days ago.  Patient reports chills.  Patient unsure of fevers.

## 2022-06-09 ENCOUNTER — Ambulatory Visit: Payer: Medicaid Other | Admitting: Nurse Practitioner

## 2022-06-09 ENCOUNTER — Encounter: Payer: Self-pay | Admitting: Nurse Practitioner

## 2022-06-09 DIAGNOSIS — Z113 Encounter for screening for infections with a predominantly sexual mode of transmission: Secondary | ICD-10-CM | POA: Diagnosis not present

## 2022-06-09 DIAGNOSIS — A599 Trichomoniasis, unspecified: Secondary | ICD-10-CM

## 2022-06-09 LAB — WET PREP FOR TRICH, YEAST, CLUE
Trichomonas Exam: POSITIVE — AB
Yeast Exam: NEGATIVE

## 2022-06-09 MED ORDER — METRONIDAZOLE 500 MG PO TABS: 500.0000 mg | ORAL_TABLET | Freq: Two times a day (BID) | ORAL | 0 refills | Status: AC
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
## 2022-06-11 NOTE — Progress Notes (Signed)
Chart reviewed by Pharmacist  Suzanne Walker PharmD, Contract Pharmacist at [facility]  

## 2022-06-13 LAB — GONOCOCCUS CULTURE

## 2022-08-19 ENCOUNTER — Telehealth: Payer: Medicaid Other

## 2022-11-16 ENCOUNTER — Ambulatory Visit: Payer: Medicaid Other | Admitting: Family Medicine

## 2022-12-07 ENCOUNTER — Ambulatory Visit (LOCAL_COMMUNITY_HEALTH_CENTER): Payer: Medicaid Other | Admitting: Advanced Practice Midwife

## 2022-12-07 VITALS — BP 147/91 | HR 67 | Ht 63.0 in | Wt 252.4 lb

## 2022-12-07 DIAGNOSIS — F17200 Nicotine dependence, unspecified, uncomplicated: Secondary | ICD-10-CM

## 2022-12-07 DIAGNOSIS — Z3009 Encounter for other general counseling and advice on contraception: Secondary | ICD-10-CM

## 2022-12-07 DIAGNOSIS — F32A Depression, unspecified: Secondary | ICD-10-CM

## 2022-12-07 DIAGNOSIS — Z309 Encounter for contraceptive management, unspecified: Secondary | ICD-10-CM | POA: Diagnosis not present

## 2022-12-07 DIAGNOSIS — R030 Elevated blood-pressure reading, without diagnosis of hypertension: Secondary | ICD-10-CM

## 2022-12-07 DIAGNOSIS — F5089 Other specified eating disorder: Secondary | ICD-10-CM

## 2022-12-07 DIAGNOSIS — I10 Essential (primary) hypertension: Secondary | ICD-10-CM | POA: Insufficient documentation

## 2022-12-07 DIAGNOSIS — A599 Trichomoniasis, unspecified: Secondary | ICD-10-CM | POA: Insufficient documentation

## 2022-12-07 LAB — WET PREP FOR TRICH, YEAST, CLUE
Trichomonas Exam: POSITIVE — AB
Yeast Exam: NEGATIVE

## 2022-12-07 MED ORDER — TINIDAZOLE 500 MG PO TABS: 2.0000 g | ORAL_TABLET | Freq: Every day | ORAL | 0 refills | Status: AC
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
## 2022-12-07 NOTE — Progress Notes (Signed)
Pt appointment for PE, Pap, and STI screening. Seen by Mable Fill. Family planning packet given and contents reviewed. Additional resources on Abnormal paps given as pt stated she had a history of abnormal Paps. Wet prep positive for Trich and reviewed with pt. Tinidazole prescribed due to pt allergy to Metronidazole. Medication instructions provided.

## 2022-12-14 LAB — IGP, APTIMA HPV
HPV Aptima: NEGATIVE
PAP Smear Comment: 0

## 2023-01-01 ENCOUNTER — Ambulatory Visit
Admission: EM | Admit: 2023-01-01 | Discharge: 2023-01-01 | Disposition: A | Discharge status: Home / Self Care | Payer: Medicaid Other | Attending: Emergency Medicine | Admitting: Emergency Medicine

## 2023-01-01 DIAGNOSIS — Z113 Encounter for screening for infections with a predominantly sexual mode of transmission: Secondary | ICD-10-CM | POA: Diagnosis not present

## 2023-01-01 DIAGNOSIS — N898 Other specified noninflammatory disorders of vagina: Secondary | ICD-10-CM | POA: Diagnosis not present

## 2023-01-01 DIAGNOSIS — Z3202 Encounter for pregnancy test, result negative: Secondary | ICD-10-CM | POA: Diagnosis not present

## 2023-01-01 LAB — POCT URINE PREGNANCY: Preg Test, Ur: NEGATIVE
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
## 2023-01-01 NOTE — Discharge Instructions (Signed)
Your tests are pending.  If your test results are positive, we will call you.  You and your sexual partner(s) may require treatment at that time.  Do not have sexual activity for at least 7 days.    Follow up with your primary care provider if your symptoms are not improving.    

## 2023-01-01 NOTE — ED Triage Notes (Signed)
Patient to Urgent Care with complaints of possible STD exposure. Reports her sexual partner has been having relations with other people. Last exposure yesterday.  Reports she has had some clear discharge. Denies any urinary symptoms or any other discomfort.

## 2023-01-02 ENCOUNTER — Ambulatory Visit: Payer: Self-pay

## 2023-01-02 LAB — CERVICOVAGINAL ANCILLARY ONLY
Bacterial Vaginitis (gardnerella): POSITIVE — AB
Candida Glabrata: NEGATIVE
Candida Vaginitis: NEGATIVE
Chlamydia: NEGATIVE
Comment: NEGATIVE
Comment: NEGATIVE
Comment: NEGATIVE
Comment: NEGATIVE
Comment: NEGATIVE
Comment: NORMAL
Neisseria Gonorrhea: NEGATIVE
Trichomonas: POSITIVE — AB

## 2023-01-02 LAB — HIV ANTIBODY (ROUTINE TESTING W REFLEX): HIV Screen 4th Generation wRfx: NONREACTIVE

## 2023-01-02 LAB — RPR: RPR Ser Ql: NONREACTIVE

## 2023-01-04 ENCOUNTER — Telehealth (HOSPITAL_COMMUNITY): Payer: Self-pay | Admitting: Emergency Medicine

## 2023-01-04 MED ORDER — METRONIDAZOLE 500 MG PO TABS: 500.0000 mg | ORAL_TABLET | Freq: Two times a day (BID) | ORAL | 0 refills | Status: DC

## 2023-01-04 NOTE — Telephone Encounter (Signed)
Patient positive for Trihcomonas on recent STI report.  Patient has Metronidazole listed as allergy.  When I reviewed with patient, she says rash to neck and arms.  Reviewed with Dr. Leonides Grills, as patient is adamant to take Metronidazole for treatment.  He okay'd, patient verbalized understanding of risk and what to do if rash happens

## 2023-01-10 ENCOUNTER — Ambulatory Visit
Admission: EM | Admit: 2023-01-10 | Discharge: 2023-01-10 | Disposition: A | Discharge status: Home / Self Care | Payer: Medicaid Other | Attending: Emergency Medicine | Admitting: Emergency Medicine

## 2023-01-10 DIAGNOSIS — J020 Streptococcal pharyngitis: Secondary | ICD-10-CM | POA: Diagnosis not present

## 2023-01-10 LAB — GROUP A STREP BY PCR: Group A Strep by PCR: DETECTED — AB

## 2023-01-10 MED ORDER — AMOXICILLIN-POT CLAVULANATE 875-125 MG PO TABS: 1.0000 | ORAL_TABLET | Freq: Two times a day (BID) | ORAL | 0 refills | Status: AC

## 2023-01-10 NOTE — ED Triage Notes (Signed)
Pt c/o sore throat x2 days. Denies any fevers. Has tried cold/flu syrup w/o relief.
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
## 2023-01-10 NOTE — Discharge Instructions (Signed)
Take the Augmentin twice daily for 10 days for treatment of your strep throat.  Gargle with warm salt water 2-3 times a day to soothe your throat, aid in pain relief, and aid in healing.  Take over-the-counter ibuprofen according to the package instructions as needed for pain.  You can also use Chloraseptic or Sucrets lozenges, 1 lozenge every 2 hours as needed for throat pain.  If you develop any new or worsening symptoms return for reevaluation.  

## 2023-01-16 ENCOUNTER — Ambulatory Visit: Payer: Medicaid Other

## 2023-05-03 ENCOUNTER — Ambulatory Visit: Payer: Medicaid Other

## 2023-05-17 LAB — HM HIV SCREENING LAB: HM HIV Screening: NEGATIVE

## 2023-05-18 ENCOUNTER — Encounter: Payer: Self-pay | Admitting: Family Medicine

## 2023-05-18 ENCOUNTER — Ambulatory Visit: Payer: Medicaid Other | Admitting: Family Medicine

## 2023-05-18 DIAGNOSIS — Z113 Encounter for screening for infections with a predominantly sexual mode of transmission: Secondary | ICD-10-CM

## 2023-05-18 DIAGNOSIS — F32A Depression, unspecified: Secondary | ICD-10-CM

## 2023-05-18 DIAGNOSIS — A599 Trichomoniasis, unspecified: Secondary | ICD-10-CM

## 2023-05-18 DIAGNOSIS — N760 Acute vaginitis: Secondary | ICD-10-CM

## 2023-05-18 LAB — WET PREP FOR TRICH, YEAST, CLUE
Trichomonas Exam: POSITIVE — AB
Yeast Exam: NEGATIVE

## 2023-05-18 MED ORDER — METRONIDAZOLE 500 MG PO TABS
500.0000 mg | ORAL_TABLET | Freq: Two times a day (BID) | ORAL | Status: AC
Start: 2023-05-18 — End: 2023-05-25
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
## 2023-05-18 NOTE — Progress Notes (Signed)
0

## 2023-05-18 NOTE — Progress Notes (Signed)
Pt is here for STD screening.  Wet mount results reviewed.  The patient was dispensed Metronidazole 500 mg #14 today. I provided counseling today regarding the medication. We discussed the medication, the side effects and when to call clinic. Patient given the opportunity to ask questions. Questions answered.  Condoms declined.  Berdie Ogren, RN

## 2023-05-18 NOTE — Progress Notes (Signed)

## 2023-09-18 ENCOUNTER — Ambulatory Visit: Payer: Medicaid Other | Admitting: Family Medicine

## 2023-09-18 VITALS — BP 142/80 | HR 70 | Ht 63.0 in | Wt 250.4 lb

## 2023-09-18 DIAGNOSIS — R030 Elevated blood-pressure reading, without diagnosis of hypertension: Secondary | ICD-10-CM

## 2023-09-18 DIAGNOSIS — Z113 Encounter for screening for infections with a predominantly sexual mode of transmission: Secondary | ICD-10-CM

## 2023-09-18 LAB — WET PREP FOR TRICH, YEAST, CLUE
Trichomonas Exam: NEGATIVE
Yeast Exam: NEGATIVE

## 2023-09-18 LAB — HM HIV SCREENING LAB: HM HIV Screening: NEGATIVE

## 2023-09-18 NOTE — Progress Notes (Signed)
 Pt here for preconception counseling and STI screening.  Wet mount results reviewed with patient.  No treatment needed at this time.  Pt encouraged to begin multivitamin daily with folic acid.  [employer] of [employer] booklet on readiness for pregnancy given.  PCP list given to patient.  Condoms declined.  To contact clinic if any further questions or concerns.-Caroleann Casler, RN

## 2023-09-18 NOTE — Progress Notes (Signed)
   [facility] Problem Visit  [facility]  Subjective:  ZOELLE MARKUS is a 38 y.o. being seen today for   Chief Complaint  Patient presents with   Acute Visit    Wants wet mount and STI testing.  Notes vaginal dryness x 2 months    HPI  Reports to clinic for STI testing and discuss preconception counseling.  Health Maintenance Due  Topic Date Due   Hepatitis C Screening  Never done   INFLUENZA VACCINE  Never done   COVID-19 Vaccine (1 - 2024-25 season) Never done    ROS  The following portions of the patient's history were reviewed and updated as appropriate: allergies, current medications, past family history, past medical history, past social history, past surgical history and problem list. Problem list updated.   See flowsheet for other program required questions.  Objective:   Vitals:   09/18/23 1041  BP: (!) 142/80  Pulse: 70  Weight: 250 lb 6.4 oz (113.6 kg)  Height: 5' 3 (1.6 m)    Physical Exam Constitutional:      Appearance: She is obese.  HENT:     Head: Normocephalic and atraumatic.  Pulmonary:     Effort: Pulmonary effort is normal.  Abdominal:     Palpations: Abdomen is soft.  Musculoskeletal:        General: Normal range of motion.  Skin:    General: Skin is warm and dry.  Neurological:     General: No focal deficit present.     Mental Status: She is alert.  Psychiatric:        Mood and Affect: Mood normal.        Behavior: Behavior normal.       Assessment and Plan:  KRYSTINA STRIETER is a 38 y.o. female presenting to the [facility] Department for a Women's Health problem visit  1. Screening for venereal disease (Primary)  - Chlamydia/Gonorrhea [place] [facility] - HIV [place] [facility] - Syphilis Serology, [place] [facility] - WET PREP FOR TRICH, YEAST, CLUE  2. Elevated blood pressure reading 147/91 12/07/22 -reviewed what high blood pressure is and how long term untreated HTN causes  damage to the body -strongly encouraged exercise and losing weight along with PCP appt for management with meds  3. Morbid obesity BMI=44.7 -reviewed exercise and weight loss for optimal pregnancy outcome  - Hgb A1c w/o eAG     No follow-ups on file.  No future appointments.  Verneta Bers, [place]

## 2023-09-19 ENCOUNTER — Telehealth: Payer: Self-pay

## 2023-09-19 LAB — HGB A1C W/O EAG: Hgb A1c MFr Bld: 5.7 % — ABNORMAL HIGH (ref 4.8–5.6)

## 2023-09-19 NOTE — Telephone Encounter (Signed)
-----   Message from [facility] sent at 09/19/2023  9:47 AM EST ----- Please call pt and let her know she has pre-DM. Encourage her to get a PCP

## 2023-10-02 NOTE — Addendum Note (Signed)
Addended by: Berdie Ogren on: 10/02/2023 12:00 PM   Modules accepted: Orders

## 2023-11-26 ENCOUNTER — Ambulatory Visit (LOCAL_COMMUNITY_HEALTH_CENTER): Admitting: Family Medicine

## 2023-11-26 VITALS — BP 151/109 | HR 77 | Ht 62.0 in | Wt 252.0 lb

## 2023-11-26 DIAGNOSIS — Z309 Encounter for contraceptive management, unspecified: Secondary | ICD-10-CM

## 2023-11-26 DIAGNOSIS — Z30013 Encounter for initial prescription of injectable contraceptive: Secondary | ICD-10-CM | POA: Diagnosis not present

## 2023-11-26 DIAGNOSIS — R030 Elevated blood-pressure reading, without diagnosis of hypertension: Secondary | ICD-10-CM

## 2023-11-26 DIAGNOSIS — Z3042 Encounter for surveillance of injectable contraceptive: Secondary | ICD-10-CM

## 2023-11-26 MED ORDER — MEDROXYPROGESTERONE ACETATE 150 MG/ML IM SUSP
150.0000 mg | Freq: Once | INTRAMUSCULAR | Status: AC
Start: 2023-11-26 — End: 2023-11-26
  Administered 2023-11-26: 150 mg via INTRAMUSCULAR

## 2023-11-26 NOTE — Progress Notes (Signed)
   [facility]  [facility]  Subjective:  Katie Sutton is a 38 y.o. being seen today for   Chief Complaint  Patient presents with   Contraception    Restart depo    HPI   Patient is here today to restart depo. LMP was 11/05/23- Reports she had unprotected sex 1 week ago- but they used the withdrawal method. Counseled that it is too late for ECP.    Health Maintenance Due  Topic Date Due   Hepatitis C Screening  Never done   INFLUENZA VACCINE  Never done   COVID-19 Vaccine (1 - 2024-25 season) Never done    ROS  The following portions of the patient's history were reviewed and updated as appropriate: allergies, current medications, past family history, past medical history, past social history, past surgical history and problem list. Problem list updated.   See flowsheet for other program required questions.  Objective:   Vitals:   11/26/23 1045  BP: (!) 151/109  Pulse: 77  Weight: 252 lb (114.3 kg)  Height: 5\' 2"  (1.575 m)    Physical Exam Deferred- due with next depo   Assessment and Plan:  Katie Sutton is a 38 y.o. female presenting to the [facility] Department for a Women's Health problem visit  1. Encounter for Depo-Provera contraception (Primary) -ok to give depo today -pt to take PT at home in 2 weeks   2. Elevated blood pressure reading 147/91 12/07/22 Continued elevated BP- 151/109- denies symptoms -strongly encouraged patient to get a PCP -reviewed risks for untreated BP  -denies smoking      No follow-ups on file.  No future appointments.  Lenice Llamas, [place]

## 2023-11-26 NOTE — Progress Notes (Signed)
 Pt here for acute visit to start Depo-Provera.  Depo consent signed.  Depo Provera 150mg  IM given without difficulty.  Pt to return in 11 weeks for annual physical and depo.  Reminder card given to patient.  Condoms declined.-Collins Scotland, RN

## 2023-11-26 NOTE — Addendum Note (Signed)
 Addended by: Collins Scotland on: 11/26/2023 11:32 AM   Modules accepted: Orders

## 2023-12-17 ENCOUNTER — Encounter: Payer: Self-pay | Admitting: Student

## 2023-12-17 ENCOUNTER — Ambulatory Visit (INDEPENDENT_AMBULATORY_CARE_PROVIDER_SITE_OTHER): Admitting: Student

## 2023-12-17 VITALS — BP 142/98 | HR 77 | Ht 62.0 in | Wt 247.0 lb

## 2023-12-17 DIAGNOSIS — F32A Depression, unspecified: Secondary | ICD-10-CM | POA: Diagnosis not present

## 2023-12-17 DIAGNOSIS — Z6841 Body Mass Index (BMI) 40.0 and over, adult: Secondary | ICD-10-CM

## 2023-12-17 DIAGNOSIS — I10 Essential (primary) hypertension: Secondary | ICD-10-CM | POA: Diagnosis not present

## 2023-12-17 DIAGNOSIS — R7303 Prediabetes: Secondary | ICD-10-CM | POA: Diagnosis not present

## 2023-12-17 DIAGNOSIS — L810 Postinflammatory hyperpigmentation: Secondary | ICD-10-CM | POA: Insufficient documentation

## 2023-12-17 MED ORDER — LOSARTAN POTASSIUM 25 MG PO TABS
25.0000 mg | ORAL_TABLET | Freq: Every day | ORAL | 1 refills | Status: DC
Start: 2023-12-17 — End: 2024-01-14

## 2023-12-17 NOTE — Assessment & Plan Note (Signed)
 Patient reports multiple areas of hyperpigmentation after developing hives from taking metronidazole. Patches are uniform in colog and regular.  Suspect patches are due to postinflammatory hyperpigmentation secondary to prior allergic reaction. Discussed sun protection and avoidance of metronidazole. Metronidazole is listed as an allergy. Referral to dermatology for further management.

## 2023-12-17 NOTE — Assessment & Plan Note (Signed)
 History of this and previously had counseling. Mild/moderately elevated scores today. PHQ9 is 11 and GAD:7 is 17. More anxious lately due to financial strain due to needing to buy a new car. Still has sadness over death of mother 5 years ago. Denies SI or HI. Declined medical management or therapy today. Will continue to monitor.

## 2023-12-17 NOTE — Progress Notes (Signed)
 New Patient Office Visit  Subjective    Patient ID: Katie Sutton, female    DOB: Jan 11, 1986  Age: 38 y.o. MRN: [id number]  CC:  Chief Complaint  Patient presents with   Establish Care   Skin Problem    X1 year, Dark spots on skin, left arm, right side of neck, upper thighs, left side buttocks and one back side of knee left, used to itch but not anymore, never seen a dermatologist before, tried aloe vera and OTC creams does not help     HPI Katie Sutton is a 44 year ol person who presents to establish care  Rash Reports itchy raised rash on neck arms and legs after taking metronidazole.  Reports rash started approximately 3 days after starting metronidazole and improves after she completes course of metronidazole.  Reports scratching and using hot water on the rash due to itching.  She has had 3 courses of metronidazole and this happens each time.  The last time she took metronidazole was sometime in the fall of 2024.  She reports rash has healed but now has hyperpigmented patches where the rashes used to be.  She is very self-conscious about this.  She does feel that the lesion on her neck has darkened.  Size and shape of patches have not changed time.  She has no symptoms simply to improve shortly after discontinuing metronidazole.  She denies CP, dyspnea, throat/tongue swelling, abdominal pain, lightheadedness or dizziness. She has tried aloe vera and tumeric without improvement.   Past Medical History:  Diagnosis Date   Hypertension    Low iron     Past Surgical History:  Procedure Laterality Date   CESAREAN SECTION     EYE SURGERY     TONSILLECTOMY      Family History  Problem Relation Age of Onset   Cancer Mother 37 - 84       colon cancer   Colon cancer Mother    Cancer Father        pancreatic cancer   Stomach cancer Father    Cancer Maternal Grandfather    Autism Son     Social History   Socioeconomic History   Marital status: Single    Spouse  name: Not on file   Number of children: 2   Years of education: 12   Highest education level: High school graduate  Occupational History   Not on file  Tobacco Use   Smoking status: Never   Smokeless tobacco: Never  Vaping Use   Vaping status: Never Used  Substance and Sexual Activity   Alcohol use: Yes    Comment: occasionally around a couple times a year   Drug use: Not Currently    Types: Marijuana    Comment: previously once a month   Sexual activity: Yes    Partners: Male    Comment: pregnancy seeking  Other Topics Concern   Not on file  Social History Narrative   Patient is currently single and living alone. She has two children 8 and 14 who are currently living out of state with her sister due to her working and the children being in remote school due to [employer]. Patient has not dated in 6 years. She reports that she has 3 best friends, and maintains relationships with her siblings and extended family. Both her mother and estranged father have passed. She reports that she does receive support from her oldest child's father, but not from the youngest.  Social Drivers of Corporate investment banker Strain: Not on file  Food Insecurity: No Food Insecurity (12/17/2023)   Hunger Vital Sign    Worried About Running Out of Food in the Last Year: Never true    Ran Out of Food in the Last Year: Never true  Transportation Needs: No Transportation Needs (12/17/2023)   PRAPARE - Administrator, Civil Service (Medical): No    Lack of Transportation (Non-Medical): No  Physical Activity: Not on file  Stress: Not on file  Social Connections: Unknown (09/26/2019)   Social Connection and Isolation Panel [NHANES]    Frequency of Communication with Friends and Family: Not on file    Frequency of Social Gatherings with Friends and Family: Not on file    Attends Religious Services: Not on file    Active Member of Clubs or Organizations: Not on file    Attends Banker  Meetings: Not on file    Marital Status: Divorced  Intimate Partner Violence: Not At Risk (12/17/2023)   Humiliation, Afraid, Rape, and Kick questionnaire    Fear of Current or Ex-Partner: No    Emotionally Abused: No    Physically Abused: No    Sexually Abused: No    ROS Refer to HPI    Objective   BP (!) 142/98   Pulse 77   Ht 5\' 2"  (1.575 m)   Wt 247 lb (112 kg)   LMP 11/26/2023   SpO2 98%   BMI 45.18 kg/m   Physical Exam Constitutional:      Appearance: Normal appearance. She is obese.  HENT:     Mouth/Throat:     Mouth: Mucous membranes are moist.     Pharynx: Oropharynx is clear.  Cardiovascular:     Rate and Rhythm: Normal rate and regular rhythm.  Pulmonary:     Effort: Pulmonary effort is normal.     Breath sounds: No rhonchi or rales.  Abdominal:     General: Abdomen is flat. Bowel sounds are normal. There is no distension.     Palpations: Abdomen is soft.     Tenderness: There is no abdominal tenderness.  Musculoskeletal:        General: Normal range of motion.     Right lower leg: No edema.     Left lower leg: No edema.  Skin:    General: Skin is warm and dry.     Capillary Refill: Capillary refill takes less than 2 seconds.     Comments: Multiple hyperpigmented patches between 1 to 3 cm of the neck, left arm, bilateral legs  Neurological:     General: No focal deficit present.     Mental Status: She is alert and oriented to person, place, and time.  Psychiatric:        Mood and Affect: Mood normal.        Behavior: Behavior normal.        12/17/2023    2:58 PM 12/07/2022    3:15 PM  Depression screen PHQ 2/9  Decreased Interest 0 2  Down, Depressed, Hopeless 2 2  PHQ - 2 Score 2 4  Altered sleeping 3 3  Tired, decreased energy 3 3  Change in appetite 1 1  Feeling bad or failure about yourself  1 2  Trouble concentrating 1 3  Moving slowly or fidgety/restless 0 2  Suicidal thoughts 0 0  PHQ-9 Score 11 18  Difficult doing work/chores  Somewhat difficult Very difficult  12/17/2023    2:58 PM  GAD 7 : Generalized Anxiety Score  Nervous, Anxious, on Edge 2  Control/stop worrying 3  Worry too much - different things 3  Trouble relaxing 2  Restless 2  Easily annoyed or irritable 3  Afraid - awful might happen 2  Total GAD 7 Score 17  Anxiety Difficulty Somewhat difficult    Assessment & Plan:  Post-inflammatory hyperpigmentation Assessment & Plan: Patient reports multiple areas of hyperpigmentation after developing hives from taking metronidazole. Patches are uniform in colog and regular.  Suspect patches are due to postinflammatory hyperpigmentation secondary to prior allergic reaction. Discussed sun protection and avoidance of metronidazole. Metronidazole is listed as an allergy. Referral to dermatology for further management.   Orders: -     Ambulatory referral to Dermatology  Primary hypertension Assessment & Plan: Elevated pressure of 151/109 at health department during visit for Depo-Provera on 11/26/2023. Referred to office to discuss today. Does get headaches occasionally, doe not check BP and is unsure if there are associated.  BP remains elevated today at 142/98 with a repeat.  Start losartan 25 mg daily Discussed low salt diet Continue work on lifestyle modifications for weight loss  Orders: -     Losartan Potassium; Take 1 tablet (25 mg total) by mouth daily.  Dispense: 30 tablet; Refill: 1  Morbid obesity with BMI of 45.0-49.9, adult [facility]) Assessment & Plan: She is working on lifestyle modifications including 35 minutes of cardio workouts 5 times a week.  She would like to increase this to 7 days a week.  She is also working with a trainer regarding her diet as well.  Estimates approximate 5 pound weight loss in the last month.  Encouraged her to continue with lifestyle modifications.   Depression, unspecified depression type Assessment & Plan: History of this and previously had counseling.  Mild/moderately elevated scores today. PHQ9 is 11 and GAD:7 is 17. More anxious lately due to financial strain due to needing to buy a new car. Still has sadness over death of mother 5 years ago. Denies SI or HI. Declined medical management or therapy today. Will continue to monitor.    Prediabetes Assessment & Plan: Will obtain A1c at next visit.      Return in about 4 weeks (around 01/14/2024) for HTN.   Barnetta Liberty, MD

## 2023-12-17 NOTE — Assessment & Plan Note (Addendum)
 She is working on lifestyle modifications including 35 minutes of cardio workouts 5 times a week.  She would like to increase this to 7 days a week.  She is also working with a trainer regarding her diet as well.  Estimates approximate 5 pound weight loss in the last month.  Encouraged her to continue with lifestyle modifications.

## 2023-12-17 NOTE — Assessment & Plan Note (Signed)
 Will obtain A1c at next visit.

## 2023-12-17 NOTE — Assessment & Plan Note (Addendum)
 Elevated pressure of 151/109 at health department during visit for Depo-Provera on 11/26/2023. Referred to office to discuss today. Does get headaches occasionally, doe not check BP and is unsure if there are associated.  BP remains elevated today at 142/98 with a repeat.  Start losartan 25 mg daily Discussed low salt diet Continue work on lifestyle modifications for weight loss

## 2024-01-14 ENCOUNTER — Encounter: Payer: Self-pay | Admitting: Student

## 2024-01-14 ENCOUNTER — Ambulatory Visit (INDEPENDENT_AMBULATORY_CARE_PROVIDER_SITE_OTHER): Admitting: Student

## 2024-01-14 VITALS — BP 136/86 | Ht 62.0 in | Wt 249.0 lb

## 2024-01-14 DIAGNOSIS — Z6841 Body Mass Index (BMI) 40.0 and over, adult: Secondary | ICD-10-CM

## 2024-01-14 DIAGNOSIS — I10 Essential (primary) hypertension: Secondary | ICD-10-CM | POA: Diagnosis not present

## 2024-01-14 MED ORDER — LOSARTAN POTASSIUM 50 MG PO TABS
50.0000 mg | ORAL_TABLET | Freq: Every day | ORAL | 3 refills | Status: AC
Start: 2024-01-14 — End: ?

## 2024-01-14 NOTE — Assessment & Plan Note (Signed)
 BP improved today to 132/84 on losartan  25 mg daily. Will increase to losartan  50 mg daily. BMP today.
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
## 2024-01-14 NOTE — Assessment & Plan Note (Signed)
 Weight is increased 2 lbs since last visit. Is exercising regularly. Having some difficulty with diet, often feels pressured to eat out. She will work on calorie tacking and reducing portion sizes.

## 2024-01-15 ENCOUNTER — Ambulatory Visit: Payer: Self-pay | Admitting: Student

## 2024-01-15 LAB — BASIC METABOLIC PANEL WITH GFR
BUN/Creatinine Ratio: 11 (ref 9–23)
BUN: 10 mg/dL (ref 6–20)
CO2: 19 mmol/L — ABNORMAL LOW (ref 20–29)
Calcium: 9.2 mg/dL (ref 8.7–10.2)
Chloride: 106 mmol/L (ref 96–106)
Creatinine, Ser: 0.88 mg/dL (ref 0.57–1.00)
Glucose: 91 mg/dL (ref 70–99)
Potassium: 4.7 mmol/L (ref 3.5–5.2)
Sodium: 140 mmol/L (ref 134–144)
eGFR: 87 mL/min/{1.73_m2} (ref >59)

## 2024-02-05 DIAGNOSIS — L271 Localized skin eruption due to drugs and medicaments taken internally: Secondary | ICD-10-CM | POA: Diagnosis not present

## 2024-02-14 ENCOUNTER — Ambulatory Visit

## 2024-02-14 VITALS — BP 136/68 | Ht 62.0 in | Wt 243.5 lb

## 2024-02-14 DIAGNOSIS — Z3009 Encounter for other general counseling and advice on contraception: Secondary | ICD-10-CM

## 2024-02-14 DIAGNOSIS — Z309 Encounter for contraceptive management, unspecified: Secondary | ICD-10-CM

## 2024-02-14 DIAGNOSIS — Z30013 Encounter for initial prescription of injectable contraceptive: Secondary | ICD-10-CM

## 2024-02-14 DIAGNOSIS — Z3042 Encounter for surveillance of injectable contraceptive: Secondary | ICD-10-CM

## 2024-02-14 MED ORDER — MEDROXYPROGESTERONE ACETATE 150 MG/ML IM SUSP
150.0000 mg | Freq: Once | INTRAMUSCULAR | Status: AC
Start: 2024-02-14 — End: 2024-02-14
  Administered 2024-02-14: 150 mg via INTRAMUSCULAR

## 2024-02-14 NOTE — Progress Notes (Signed)
 11 Weeks   3 Days since last Depo    Voices no concerns today.  Counseled to adhere to 11 to 13 week intervals between depo injections for optimal benefit.  Depo given today per order by SO Dr Bohdan Bush.  Tolerated well LUOQ .  Last annual PE 12/07/2022.  Prefers to have annual PE when next depo is due.  Next depo/annual PE due 05/01/2024 has reminder card.  Logon Uttech, RN

## 2024-03-17 ENCOUNTER — Encounter: Payer: Self-pay | Admitting: Student

## 2024-03-17 ENCOUNTER — Ambulatory Visit: Admitting: Student

## 2024-03-17 VITALS — BP 130/88 | HR 79 | Ht 62.0 in | Wt 244.0 lb

## 2024-03-17 DIAGNOSIS — I10 Essential (primary) hypertension: Secondary | ICD-10-CM

## 2024-03-17 DIAGNOSIS — Z6841 Body Mass Index (BMI) 40.0 and over, adult: Secondary | ICD-10-CM

## 2024-03-17 NOTE — Assessment & Plan Note (Signed)
 Using stair master and walker 3 mites a day and sair master 30-35 minutes Making food prep
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
## 2024-03-18 NOTE — Assessment & Plan Note (Addendum)
 BP well controlled on current medication. Continue losartan  50 mg daily. She is unable to stay for blood draw, will have obtain BMP at next visit in ~1 month. Recommended decreasing salt intake.

## 2024-03-24 ENCOUNTER — Encounter: Payer: Self-pay | Admitting: Student

## 2024-04-01 ENCOUNTER — Ambulatory Visit (INDEPENDENT_AMBULATORY_CARE_PROVIDER_SITE_OTHER): Admitting: Student

## 2024-04-01 ENCOUNTER — Other Ambulatory Visit (HOSPITAL_COMMUNITY)
Admission: RE | Admit: 2024-04-01 | Discharge: 2024-04-01 | Disposition: A | Discharge status: Home / Self Care | Source: Ambulatory Visit | Attending: Student | Admitting: Student

## 2024-04-01 ENCOUNTER — Encounter: Payer: Self-pay | Admitting: Student

## 2024-04-01 VITALS — BP 124/80 | HR 82 | Ht 62.0 in | Wt 238.2 lb

## 2024-04-01 DIAGNOSIS — Z3009 Encounter for other general counseling and advice on contraception: Secondary | ICD-10-CM

## 2024-04-01 DIAGNOSIS — Z1159 Encounter for screening for other viral diseases: Secondary | ICD-10-CM | POA: Diagnosis not present

## 2024-04-01 DIAGNOSIS — F32A Depression, unspecified: Secondary | ICD-10-CM | POA: Diagnosis not present

## 2024-04-01 DIAGNOSIS — Z6841 Body Mass Index (BMI) 40.0 and over, adult: Secondary | ICD-10-CM | POA: Diagnosis not present

## 2024-04-01 DIAGNOSIS — Z0000 Encounter for general adult medical examination without abnormal findings: Secondary | ICD-10-CM | POA: Insufficient documentation

## 2024-04-01 DIAGNOSIS — I10 Essential (primary) hypertension: Secondary | ICD-10-CM

## 2024-04-01 DIAGNOSIS — R7303 Prediabetes: Secondary | ICD-10-CM | POA: Diagnosis not present

## 2024-04-01 DIAGNOSIS — E66813 Obesity, class 3: Secondary | ICD-10-CM | POA: Diagnosis not present

## 2024-04-01 NOTE — Assessment & Plan Note (Signed)
 Weight decreased 6 lbs in the last 2 weeks weight is 238lb today. She is continue to work on exercise and diet modifications. Encouraged her to continue this.

## 2024-04-01 NOTE — Assessment & Plan Note (Signed)
 PHQ9 noted to be positive for thoughts of self harm. Denies thoughts of hurting herself, no history self harm, does not have a plan. Able to contract for safety. Feels mood is good today today. Decline counseling or medical therapy.

## 2024-04-01 NOTE — Assessment & Plan Note (Signed)
 Lab Results  Component Value Date   HGBA1C 5.7 (H) 09/18/2023   A1c today.

## 2024-04-01 NOTE — Assessment & Plan Note (Addendum)
 Labwork ordered today STI labs [place], she is sexually active with 1 female partner.  [facility]. Interested in nexplanon. Referral made to GYN  Last pap 12/07/22 NILM, HPV negative next due in 2029
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
## 2024-04-01 NOTE — Assessment & Plan Note (Addendum)
Controlled  - continue losartan 50 mg daily

## 2024-04-02 LAB — CBC WITH DIFFERENTIAL/PLATELET
Basophils Absolute: 0 x10E3/uL (ref 0.0–0.2)
Basos: 1 %
EOS (ABSOLUTE): 0.1 x10E3/uL (ref 0.0–0.4)
Eos: 2 %
Hematocrit: 38.9 % (ref 34.0–46.6)
Hemoglobin: 13 g/dL (ref 11.1–15.9)
Immature Grans (Abs): 0 x10E3/uL (ref 0.0–0.1)
Immature Granulocytes: 0 %
Lymphocytes Absolute: 1.8 x10E3/uL (ref 0.7–3.1)
Lymphs: 35 %
MCH: 32.4 pg (ref 26.6–33.0)
MCHC: 33.4 g/dL (ref 31.5–35.7)
MCV: 97 fL (ref 79–97)
Monocytes Absolute: 0.3 x10E3/uL (ref 0.1–0.9)
Monocytes: 6 %
Neutrophils Absolute: 3 x10E3/uL (ref 1.4–7.0)
Neutrophils: 56 %
Platelets: 250 x10E3/uL (ref 150–450)
RBC: 4.01 x10E6/uL (ref 3.77–5.28)
RDW: 12.1 % (ref 11.7–15.4)
WBC: 5.3 x10E3/uL (ref 3.4–10.8)

## 2024-04-02 LAB — HEMOGLOBIN A1C
Est. average glucose Bld gHb Est-mCnc: 117 mg/dL
Hgb A1c MFr Bld: 5.7 % — ABNORMAL HIGH (ref 4.8–5.6)

## 2024-04-02 LAB — BASIC METABOLIC PANEL WITH GFR
BUN/Creatinine Ratio: 9 (ref 9–23)
BUN: 8 mg/dL (ref 6–20)
CO2: 20 mmol/L (ref 20–29)
Calcium: 9.2 mg/dL (ref 8.7–10.2)
Chloride: 108 mmol/L — ABNORMAL HIGH (ref 96–106)
Creatinine, Ser: 0.87 mg/dL (ref 0.57–1.00)
Glucose: 87 mg/dL (ref 70–99)
Potassium: 4.3 mmol/L (ref 3.5–5.2)
Sodium: 142 mmol/L (ref 134–144)
eGFR: 88 mL/min/1.73 (ref >59)

## 2024-04-02 LAB — HIV ANTIBODY (ROUTINE TESTING W REFLEX): HIV Screen 4th Generation wRfx: NONREACTIVE

## 2024-04-02 LAB — CERVICOVAGINAL ANCILLARY ONLY
Bacterial Vaginitis (gardnerella): POSITIVE — AB
Candida Glabrata: NEGATIVE
Candida Vaginitis: NEGATIVE
Chlamydia: NEGATIVE
Comment: NEGATIVE
Comment: NEGATIVE
Comment: NEGATIVE
Comment: NEGATIVE
Comment: NEGATIVE
Comment: NORMAL
Neisseria Gonorrhea: NEGATIVE
Trichomonas: NEGATIVE

## 2024-04-02 LAB — RPR: RPR Ser Ql: NONREACTIVE

## 2024-04-02 LAB — LIPID PANEL
Chol/HDL Ratio: 3.5 ratio (ref 0.0–4.4)
Cholesterol, Total: 149 mg/dL (ref 100–199)
HDL: 43 mg/dL (ref >39)
LDL Chol Calc (NIH): 96 mg/dL (ref 0–99)
Triglycerides: 43 mg/dL (ref 0–149)
VLDL Cholesterol Cal: 10 mg/dL (ref 5–40)

## 2024-04-02 LAB — HEPATITIS C ANTIBODY: Hep C Virus Ab: NONREACTIVE

## 2024-04-02 LAB — TSH: TSH: 0.847 u[IU]/mL (ref 0.450–4.500)

## 2024-04-03 ENCOUNTER — Ambulatory Visit: Payer: Self-pay | Admitting: Student

## 2024-05-08 NOTE — Progress Notes (Signed)
 Medical Nutrition Therapy  Appointment Start time:  [phone]  Appointment End time:  1512  Primary concerns today: eating healthier, reduce blood pressure  Referral diagnosis: Morbid obesity with BMI of 45.0-49.9, adult (HCC)  Preferred learning style: no preference indicated Learning readiness:  change in progress   NUTRITION ASSESSMENT  Clinical Medical Hx:  Past Medical History:  Diagnosis Date   Hypertension    Low iron     Medications:  Current Outpatient Medications:    losartan  (COZAAR ) 50 MG tablet, Take 1 tablet (50 mg total) by mouth daily., Disp: 90 tablet, Rfl: 3   Labs:  Lab Results  Component Value Date   HGBA1C 5.7 (H) 04/01/2024   BP Readings from Last 3 Encounters:  04/01/24 124/80  03/17/24 130/88  02/14/24 136/68   Last vitamin D No results found for: 25OHVITD2, 25OHVITD3, VD25OH No results found for: IRON, TIBC, FERRITIN Notable Signs/Symptoms:  Wt Readings from Last 3 Encounters:  04/01/24 238 lb 4 oz (108.1 kg)  03/17/24 244 lb (110.7 kg)  02/14/24 243 lb 8 oz (110.5 kg)   Lifestyle & Dietary Hx Pt presents today alone. Pt reports a desire to improve blood pressure and eat healthier. Pt reports she has made the following changes including avoiding sugary sweetened beverages and eating out less for the past 6 months. Pt reports she is currently moving her residence and feels this is increasing her stress levels. Pt reports her energy levels are low. Pt reports she is working full time mostly standing and walking average 3-4 miles or 10K daily.  Pt reports maintaining her gym schedule has been challenging with commuting and plans to get back to the gym upon moving. Pt reports she is lactose intolerant and states she can tolerate yogurt and selects almond milk. Pt was provided therapy resources today to her mychart.  Pt reports her sleep is fair. Pt reports she does skips meals at time when her body weight not where Pt desires.All Pt's questions  were answered during this encounter.   Estimated daily fluid intake: ~67 oz Supplements: vitamin D Sleep: fair; on average 5-7 hours interrupted  Stress / self-care: 10 out of 10 /self care includes: shopping, cooking,  Current average weekly physical activity: 10 K steps, 35 minutes gym hard to  be consistent  24-Hr Dietary Recall First Meal: skips or vinegar shot or greek yogurt with fruit or protein shake made with protein powder, frozen, almond milk  Snack: nuts Second Meal:skipped or nuts, water or salad with tuna, dressing, water  Snack: none Third Meal: meat loaf and mashed potatoes  Snack: none Beverages: water, Mio, sprite  NUTRITION DIAGNOSIS  NB-1.1 Food and nutrition-related knowledge deficit As related to no prior nutrition.  As evidenced by Pt's reports and dietary recall.   NUTRITION INTERVENTION  Nutrition education (E-1) on the following topics:  Fruits & Vegetables: Aim to fill half your plate with a variety of fruits and vegetables. They are rich in vitamins, minerals, and fiber, and can help reduce the risk of chronic diseases. Choose a colorful assortment of fruits and vegetables to ensure you get a wide range of nutrients. Grains and Starches: Make at least half of your grain choices whole grains, such as brown rice, whole wheat bread, and oats. Whole grains provide fiber, which aids in digestion and healthy cholesterol levels. Aim for whole forms of starchy vegetables such as potatoes, sweet potatoes, beans, peas, and corn, which are fiber rich and provide many vitamins and minerals.  Protein:  Incorporate lean sources of protein, such as poultry, fish, beans, nuts, and seeds, into your meals. Protein is essential for building and repairing tissues, staying full, balancing blood sugar, as well as supporting immune function. Dairy: Include low-fat or fat-free dairy products like milk, yogurt, and cheese in your diet. Dairy foods are excellent sources of calcium and  vitamin D, which are crucial for bone health.  Physical Activity: Aim for 60 minutes of physical activity daily. Regular physical activity promotes overall health-including helping to reduce risk for heart disease and diabetes, promoting mental health, and helping us  sleep better.   Handouts Provided Include  Move You way-[employer] Plate Planner- [employer]  Learning Style & Readiness for Change Teaching method utilized: Visual & Auditory  Demonstrated degree of understanding via: Teach Back  Barriers to learning/adherence to lifestyle change: stress   Goals Established by Pt Aim for balanced meals on schedule and avoid skipping.  Try a 9 inch plate with 1/2 non starchy vegetables, 1/4 protein, 1/4 carbohydrate Great job with omitting sugary sweetened beverages! Keep it up!   MONITORING & EVALUATION Dietary intake, weekly physical activity  Next Steps  Patient is to return 07/14/2024

## 2024-05-12 ENCOUNTER — Encounter: Attending: Student | Admitting: Dietician

## 2024-05-12 DIAGNOSIS — Z713 Dietary counseling and surveillance: Secondary | ICD-10-CM | POA: Diagnosis not present

## 2024-05-12 DIAGNOSIS — Z6841 Body Mass Index (BMI) 40.0 and over, adult: Secondary | ICD-10-CM | POA: Insufficient documentation

## 2024-05-12 NOTE — Patient Instructions (Signed)
 Aim for balanced meals on schedule and avoid skipping.  Try a 9 inch plate with 1/2 non starchy vegetables, 1/4 protein, 1/4 carbohydrate Great job with omitting sugary sweetened beverages! Keep it up!

## 2024-05-14 ENCOUNTER — Ambulatory Visit: Admitting: Certified Nurse Midwife

## 2024-05-14 VITALS — BP 125/84 | HR 90 | Ht 62.0 in | Wt 236.8 lb

## 2024-05-14 DIAGNOSIS — Z30017 Encounter for initial prescription of implantable subdermal contraceptive: Secondary | ICD-10-CM

## 2024-05-14 DIAGNOSIS — Z3202 Encounter for pregnancy test, result negative: Secondary | ICD-10-CM | POA: Diagnosis not present

## 2024-05-14 LAB — POCT URINE PREGNANCY: Preg Test, Ur: NEGATIVE

## 2024-05-14 MED ORDER — ETONOGESTREL 68 MG ~~LOC~~ IMPL
68.0000 mg | DRUG_IMPLANT | Freq: Once | SUBCUTANEOUS | Status: AC
Start: 2024-05-14 — End: 2024-05-14
  Administered 2024-05-14: 68 mg via SUBCUTANEOUS

## 2024-05-14 NOTE — Patient Instructions (Addendum)
 Nexplanon Instructions After Insertion  Keep bandage clean and dry for 24 hours  May use ice/Tylenol/Ibuprofen for soreness or pain  If you develop fever, drainage or increased warmth from incision site-contact office immediately  Etonogestrel Implant What is this medication? ETONOGESTREL (et oh noe JES trel) prevents ovulation and pregnancy. It belongs to a group of medications called contraceptives. This medication is a progestin hormone. This medicine may be used for other purposes; ask your health care provider or pharmacist if you have questions. COMMON BRAND NAME(S): Implanon, Nexplanon What should I tell my care team before I take this medication? They need to know if you have any of these conditions: Abnormal vaginal bleeding Blood clots Blood vessel disease Breast, cervical, endometrial, ovarian, liver, or uterine cancer Diabetes Gallbladder disease Heart disease or recent heart attack High blood pressure High cholesterol or triglycerides Kidney disease Liver disease Migraine headaches Seizures Stroke Tobacco use An unusual or allergic reaction to etonogestrel, other medications, foods, dyes, or preservatives Pregnant or trying to get pregnant Breastfeeding How should I use this medication? This device is inserted just under the skin on the inner side of your upper arm by your care team. Talk to your care team about the use of this medication in children. Special care may be needed. Overdosage: If you think you have taken too much of this medicine contact a poison control center or emergency room at once. NOTE: This medicine is only for you. Do not share this medicine with others. What if I miss a dose? This does not apply. What may interact with this medication? Do not take this medication with any of the following: Amprenavir Fosamprenavir This medication may also interact with the  following: Acitretin Aprepitant Armodafinil Bexarotene Bosentan Carbamazepine Certain antivirals for HIV or hepatitis Certain medications for fungal infections, such as fluconazole, ketoconazole, itraconazole, or voriconazole Cyclosporine Felbamate Griseofulvin Lamotrigine Modafinil Oxcarbazepine Phenobarbital Phenytoin Primidone Rifabutin Rifampin Rifapentine St. John's wort Topiramate This list may not describe all possible interactions. Give your health care provider a list of all the medicines, herbs, non-prescription drugs, or dietary supplements you use. Also tell them if you smoke, drink alcohol, or use illegal drugs. Some items may interact with your medicine. What should I watch for while using this medication? Visit your care team for regular checks on your progress. Using this medication does not protect you or your partner against HIV or other sexually transmitted infections (STIs). You should be able to feel the implant by pressing your fingertips over the skin where it was inserted. Contact your care team if you cannot feel the implant, and use a non-hormonal birth control method (such as condoms) until your care team confirms that the implant is in place. Contact your care team if you think that the implant may have broken or become bent while in your arm. You will receive a user card from your care team after the implant is inserted. The card is a record of the location of the implant in your upper arm and when it should be removed. Keep this card with your health records. What side effects may I notice from receiving this medication? Side effects that you should report to your care team as soon as possible: Allergic reactions--skin rash, itching, hives, swelling of the face, lips, tongue, or throat Blood clot--pain, swelling, or warmth in the leg, shortness of breath, chest pain Gallbladder problems--severe stomach pain, nausea, vomiting, fever Increase in blood  pressure Liver injury--right upper belly pain, loss of appetite, nausea,  light-colored stool, dark yellow or brown urine, yellowing skin or eyes, unusual weakness or fatigue New or worsening migraines or headaches Pain, redness, or irritation at injection site Stroke--sudden numbness or weakness of the face, arm, or leg, trouble speaking, confusion, trouble walking, loss of balance or coordination, dizziness, severe headache, change in vision Unusual vaginal discharge, itching, or odor Worsening mood, feelings of depression Side effects that usually do not require medical attention (report to your care team if they continue or are bothersome): Breast pain or tenderness Dark patches of skin on the face or other sun-exposed areas Irregular menstrual cycles or spotting Nausea Weight gain This list may not describe all possible side effects. Call your doctor for medical advice about side effects. You may report side effects to FDA at 1-800-FDA-1088. Where should I keep my medication? This medication is given in a hospital or clinic and will not be stored at home. NOTE: This sheet is a summary. It may not cover all possible information. If you have questions about this medicine, talk to your doctor, pharmacist, or health care provider.  2024 Elsevier/Gold Standard (2022-03-28 00:00:00)

## 2024-05-14 NOTE — Progress Notes (Unsigned)
     GYNECOLOGY OFFICE PROCEDURE NOTE  Katie Sutton is a 38 y.o. [id number] here for Nexplanon  {Insertion or Removal:24885}.    Last pap smear: Result Date Procedure Results Follow-ups  12/07/2022 IGP, Aptima HPV DIAGNOSIS:: Comment Specimen adequacy:: Comment Clinician Provided ICD10: Comment Performed by:: Comment QC reviewed by:: Comment PAP Smear Comment: . Note:: Comment Test Methodology: Comment HPV Aptima: Negative      Nexplanon  {Insertion or Removal:24885} Procedure {Nexplanon  Insertion/Removal:24886}   Mathis LITTIE Getting, CMA   05/14/24

## 2024-07-02 NOTE — Progress Notes (Deleted)
 Medical Nutrition Therapy  Appointment Start time:  ***  Appointment End time:  ***  Primary concerns today: ***  Referral diagnosis:  Morbid obesity with BMI of 45.0-49.9, adult (HCC) Preferred learning style: *** (auditory, visual, hands on, no preference indicated) Learning readiness: *** (not ready, contemplating, ready, change in progress)   NUTRITION ASSESSMENT    Clinical Medical Hx: *** Medications: *** Labs: *** Notable Signs/Symptoms: ***  Lifestyle & Dietary Hx ***  Estimated daily fluid intake: *** oz Supplements: *** Sleep: *** Stress / self-care: *** Current average weekly physical activity: ***  24-Hr Dietary Recall First Meal: *** Snack: *** Second Meal: *** Snack: *** Third Meal: *** Snack: *** Beverages: ***  Estimated Energy Needs Calories: *** Carbohydrate: ***g Protein: ***g Fat: ***g   NUTRITION DIAGNOSIS  {CHL AMB NUTRITIONAL DIAGNOSIS:[phone]}   NUTRITION INTERVENTION  Nutrition education (E-1) on the following topics:  ***  Handouts Provided Include  ***  Learning Style & Readiness for Change Teaching method utilized: Visual & Auditory  Demonstrated degree of understanding via: Teach Back  Barriers to learning/adherence to lifestyle change: ***  Goals Established by Pt ***   MONITORING & EVALUATION Dietary intake, weekly physical activity, and *** in ***.  Next Steps  Patient is to ***.

## 2024-07-14 ENCOUNTER — Ambulatory Visit: Admitting: Dietician

## 2024-09-30 ENCOUNTER — Ambulatory Visit: Admitting: Student

## 2024-10-13 ENCOUNTER — Ambulatory Visit: Admitting: Student
